# Patient Record
Sex: Female | Born: 1955 | Race: White | Hispanic: No | Smoking: Former smoker
Health system: Southern US, Community
[De-identification: ages and names within clinical notes are randomized; demographics above are authoritative.]

## PROBLEM LIST (undated history)

## (undated) DIAGNOSIS — I7300 Raynaud's syndrome without gangrene: Secondary | ICD-10-CM

## (undated) DIAGNOSIS — J439 Emphysema, unspecified: Secondary | ICD-10-CM

## (undated) DIAGNOSIS — Z87891 Personal history of nicotine dependence: Secondary | ICD-10-CM

## (undated) DIAGNOSIS — M5136 Other intervertebral disc degeneration, lumbar region: Secondary | ICD-10-CM

## (undated) DIAGNOSIS — M064 Inflammatory polyarthropathy: Secondary | ICD-10-CM

## (undated) DIAGNOSIS — M1990 Unspecified osteoarthritis, unspecified site: Secondary | ICD-10-CM

## (undated) DIAGNOSIS — M419 Scoliosis, unspecified: Secondary | ICD-10-CM

## (undated) DIAGNOSIS — K635 Polyp of colon: Secondary | ICD-10-CM

## (undated) DIAGNOSIS — I10 Essential (primary) hypertension: Secondary | ICD-10-CM

## (undated) DIAGNOSIS — M51369 Other intervertebral disc degeneration, lumbar region without mention of lumbar back pain or lower extremity pain: Secondary | ICD-10-CM

## (undated) DIAGNOSIS — M349 Systemic sclerosis, unspecified: Principal | ICD-10-CM

## (undated) HISTORY — DX: Emphysema, unspecified: J43.9

## (undated) HISTORY — PX: SHOULDER SURGERY: SHX246

## (undated) HISTORY — DX: Other intervertebral disc degeneration, lumbar region: M51.36

## (undated) HISTORY — DX: Personal history of nicotine dependence: Z87.891

## (undated) HISTORY — DX: Polyp of colon: K63.5

## (undated) HISTORY — DX: Essential (primary) hypertension: I10

## (undated) HISTORY — DX: Other intervertebral disc degeneration, lumbar region without mention of lumbar back pain or lower extremity pain: M51.369

## (undated) HISTORY — DX: Scoliosis, unspecified: M41.9

## (undated) HISTORY — DX: Raynaud's syndrome without gangrene: I73.00

## (undated) HISTORY — PX: BREAST EXCISIONAL BIOPSY: SUR124

## (undated) HISTORY — DX: Unspecified osteoarthritis, unspecified site: M19.90

## (undated) HISTORY — DX: Systemic sclerosis, unspecified: M34.9

## (undated) HISTORY — DX: Inflammatory polyarthropathy: M06.4

---

## 1986-04-24 HISTORY — PX: LAPAROSCOPIC HYSTERECTOMY: SHX1926

## 1986-04-24 HISTORY — PX: ABDOMINAL HYSTERECTOMY: SHX81

## 1997-08-27 ENCOUNTER — Other Ambulatory Visit: Admission: RE | Admit: 1997-08-27 | Discharge: 1997-08-27 | Payer: Self-pay | Admitting: Obstetrics and Gynecology

## 1999-04-06 ENCOUNTER — Other Ambulatory Visit: Admission: RE | Admit: 1999-04-06 | Discharge: 1999-04-06 | Payer: Self-pay | Admitting: Obstetrics and Gynecology

## 2002-09-08 ENCOUNTER — Other Ambulatory Visit: Admission: RE | Admit: 2002-09-08 | Discharge: 2002-09-08 | Payer: Self-pay | Admitting: Family Medicine

## 2005-04-24 HISTORY — PX: CARPAL TUNNEL RELEASE: SHX101

## 2005-05-03 ENCOUNTER — Other Ambulatory Visit: Admission: RE | Admit: 2005-05-03 | Discharge: 2005-05-03 | Payer: Self-pay | Admitting: Family Medicine

## 2005-08-20 ENCOUNTER — Encounter: Admission: RE | Admit: 2005-08-20 | Discharge: 2005-08-20 | Payer: Self-pay | Admitting: Family Medicine

## 2010-04-24 DIAGNOSIS — M349 Systemic sclerosis, unspecified: Secondary | ICD-10-CM

## 2010-04-24 HISTORY — PX: DOPPLER ECHOCARDIOGRAPHY: SHX263

## 2010-04-24 HISTORY — DX: Systemic sclerosis, unspecified: M34.9

## 2010-05-06 ENCOUNTER — Ambulatory Visit (HOSPITAL_COMMUNITY)
Admission: RE | Admit: 2010-05-06 | Discharge: 2010-05-06 | Payer: Self-pay | Source: Home / Self Care | Attending: Family Medicine | Admitting: Family Medicine

## 2010-07-01 ENCOUNTER — Ambulatory Visit
Admission: RE | Admit: 2010-07-01 | Discharge: 2010-07-01 | Disposition: A | Discharge status: Home / Self Care | Payer: Medicaid Other | Source: Ambulatory Visit | Attending: Rheumatology | Admitting: Rheumatology

## 2010-07-01 ENCOUNTER — Other Ambulatory Visit: Payer: Self-pay | Admitting: Rheumatology

## 2010-07-01 DIAGNOSIS — M064 Inflammatory polyarthropathy: Secondary | ICD-10-CM

## 2012-04-24 DIAGNOSIS — K635 Polyp of colon: Secondary | ICD-10-CM

## 2012-04-24 HISTORY — DX: Polyp of colon: K63.5

## 2012-04-24 HISTORY — PX: BREAST BIOPSY: SHX20

## 2012-05-30 ENCOUNTER — Ambulatory Visit (INDEPENDENT_AMBULATORY_CARE_PROVIDER_SITE_OTHER): Payer: Medicare PPO | Admitting: Family Medicine

## 2012-05-30 ENCOUNTER — Encounter: Payer: Self-pay | Admitting: Family Medicine

## 2012-05-30 VITALS — BP 150/82 | HR 68 | Temp 97.8°F | Ht 66.5 in | Wt 163.2 lb

## 2012-05-30 DIAGNOSIS — J438 Other emphysema: Secondary | ICD-10-CM

## 2012-05-30 DIAGNOSIS — M545 Low back pain, unspecified: Secondary | ICD-10-CM | POA: Insufficient documentation

## 2012-05-30 DIAGNOSIS — M349 Systemic sclerosis, unspecified: Secondary | ICD-10-CM

## 2012-05-30 DIAGNOSIS — F17200 Nicotine dependence, unspecified, uncomplicated: Secondary | ICD-10-CM

## 2012-05-30 DIAGNOSIS — M1990 Unspecified osteoarthritis, unspecified site: Secondary | ICD-10-CM | POA: Insufficient documentation

## 2012-05-30 DIAGNOSIS — J439 Emphysema, unspecified: Secondary | ICD-10-CM | POA: Insufficient documentation

## 2012-05-30 DIAGNOSIS — M129 Arthropathy, unspecified: Secondary | ICD-10-CM

## 2012-05-30 DIAGNOSIS — Z23 Encounter for immunization: Secondary | ICD-10-CM

## 2012-05-30 MED ORDER — CYCLOBENZAPRINE HCL 5 MG PO TABS: 5.0000 mg | ORAL_TABLET | Freq: Two times a day (BID) | ORAL | Status: DC | PRN

## 2012-05-30 MED ORDER — NAPROXEN 500 MG PO TABS: 500.0000 mg | ORAL_TABLET | Freq: Two times a day (BID) | ORAL | Status: DC | PRN
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
## 2012-05-30 NOTE — Patient Instructions (Addendum)
Keep an eye on blood pressure, if consistently >140/90, call me Pass by Marion's office to re establish with Dr. Juanetta Gosling Rheum. Return at your convenience fasting for blood work, afterwards for welcome to medicare visit. Good to meet you today, call us with questions. Flu shot today. Refilled flexeril and naprosyn.  Let us know when you need refills.

## 2012-05-30 NOTE — Assessment & Plan Note (Signed)
Continue to encourage cessation. 

## 2012-05-30 NOTE — Assessment & Plan Note (Signed)
Will request records from rheum and prior PCP. Restart naprosyn/flexeril today.

## 2012-05-30 NOTE — Assessment & Plan Note (Signed)
Requested latest spirometry (2012 per pt)

## 2012-05-30 NOTE — Assessment & Plan Note (Signed)
Refer back to rheum.  Encouraged smoking cessation.

## 2012-06-11 ENCOUNTER — Telehealth: Payer: Self-pay | Admitting: *Deleted

## 2012-06-11 NOTE — Telephone Encounter (Signed)
[employer] will not cover Flexeril. They will cover Baclofen. Information in your IN box. Please advise. [facility] [address]

## 2012-06-12 MED ORDER — BACLOFEN 10 MG PO TABS: 10.0000 mg | ORAL_TABLET | Freq: Three times a day (TID) | ORAL | Status: DC | PRN

## 2012-06-12 NOTE — Telephone Encounter (Signed)
Patient notified

## 2012-06-12 NOTE — Telephone Encounter (Signed)
plz notify insurance will cover baclofen better so I'd like her to do trial of this med.

## 2012-06-19 ENCOUNTER — Encounter: Payer: Self-pay | Admitting: Family Medicine

## 2012-07-07 ENCOUNTER — Encounter: Payer: Self-pay | Admitting: Family Medicine

## 2012-08-15 ENCOUNTER — Other Ambulatory Visit: Payer: Self-pay | Admitting: Family Medicine

## 2012-08-15 DIAGNOSIS — Z0000 Encounter for general adult medical examination without abnormal findings: Secondary | ICD-10-CM

## 2012-08-15 DIAGNOSIS — M349 Systemic sclerosis, unspecified: Secondary | ICD-10-CM

## 2012-08-21 ENCOUNTER — Other Ambulatory Visit: Payer: Medicare PPO

## 2012-08-23 ENCOUNTER — Other Ambulatory Visit (INDEPENDENT_AMBULATORY_CARE_PROVIDER_SITE_OTHER): Payer: Medicare PPO

## 2012-08-23 DIAGNOSIS — Z0000 Encounter for general adult medical examination without abnormal findings: Secondary | ICD-10-CM

## 2012-08-23 DIAGNOSIS — Z136 Encounter for screening for cardiovascular disorders: Secondary | ICD-10-CM

## 2012-08-23 DIAGNOSIS — M349 Systemic sclerosis, unspecified: Secondary | ICD-10-CM

## 2012-08-23 LAB — CBC WITH DIFFERENTIAL/PLATELET
Eosinophils Absolute: 0.2 10*3/uL (ref 0.0–0.7)
HCT: 42.1 % (ref 36.0–46.0)
Lymphocytes Relative: 30.2 % (ref 12.0–46.0)
MCV: 91.3 fl (ref 78.0–100.0)
Neutrophils Relative %: 57 % (ref 43.0–77.0)
Platelets: 309 10*3/uL (ref 150.0–400.0)
RBC: 4.61 Mil/uL (ref 3.87–5.11)
WBC: 6.2 10*3/uL (ref 4.5–10.5)

## 2012-08-26 LAB — LIPID PANEL
Cholesterol: 207 mg/dL — ABNORMAL HIGH (ref 0–200)
HDL: 61.5 mg/dL (ref >39.00)
Total CHOL/HDL Ratio: 3

## 2012-08-26 LAB — COMPREHENSIVE METABOLIC PANEL
Alkaline Phosphatase: 101 U/L (ref 39–117)
CO2: 26 mEq/L (ref 19–32)
Calcium: 9.5 mg/dL (ref 8.4–10.5)
Chloride: 105 mEq/L (ref 96–112)
Creatinine, Ser: 0.9 mg/dL (ref 0.4–1.2)
GFR: 72.84 mL/min (ref >60.00)
Potassium: 4.7 mEq/L (ref 3.5–5.1)
Total Protein: 8.1 g/dL (ref 6.0–8.3)

## 2012-08-26 LAB — LDL CHOLESTEROL, DIRECT: Direct LDL: 118 mg/dL

## 2012-08-27 ENCOUNTER — Encounter: Payer: Self-pay | Admitting: Family Medicine

## 2012-08-27 ENCOUNTER — Encounter: Payer: Self-pay | Admitting: Gastroenterology

## 2012-08-27 ENCOUNTER — Ambulatory Visit (INDEPENDENT_AMBULATORY_CARE_PROVIDER_SITE_OTHER): Payer: Medicare PPO | Admitting: Family Medicine

## 2012-08-27 VITALS — BP 144/82 | HR 20 | Temp 97.8°F | Ht 66.5 in | Wt 164.5 lb

## 2012-08-27 DIAGNOSIS — Z139 Encounter for screening, unspecified: Secondary | ICD-10-CM

## 2012-08-27 DIAGNOSIS — Z0000 Encounter for general adult medical examination without abnormal findings: Secondary | ICD-10-CM

## 2012-08-27 DIAGNOSIS — F17200 Nicotine dependence, unspecified, uncomplicated: Secondary | ICD-10-CM

## 2012-08-27 DIAGNOSIS — Z1231 Encounter for screening mammogram for malignant neoplasm of breast: Secondary | ICD-10-CM

## 2012-08-27 DIAGNOSIS — Z23 Encounter for immunization: Secondary | ICD-10-CM

## 2012-08-27 DIAGNOSIS — Z1211 Encounter for screening for malignant neoplasm of colon: Secondary | ICD-10-CM

## 2012-08-27 NOTE — Assessment & Plan Note (Signed)
I have personally reviewed the Welcome to Medicare questionnaire and have noted 1. The patient's medical and social history 2. Their use of alcohol, tobacco or illicit drugs 3. Their current medications and supplements 4. The patient's functional ability including ADL's, fall risks, home safety risks and hearing or visual impairment. 5. Diet and physical activity 6. Evidence for depression or mood disorders The patients weight, height, BMI have been recorded in the chart.  Hearing and vision has been addressed. I have made referrals, counseling and provided education to the patient based review of the above and I have provided the pt with a written personalized care plan for preventive services. See scanned questionairre. Advanced directives discussed: does not want prolonged life support  Reviewed preventative protocols and updated unless pt declined.  Breast exam today. To schedule mammogram and colonscoopy.  Prior saw [facility] for mammo - advised to call and reschedule. EKG for welcome to medicare visit done today.

## 2012-08-27 NOTE — Addendum Note (Signed)
Addended by: Josph Macho A on: 08/27/2012 12:10 PM   Modules accepted: Orders

## 2012-08-27 NOTE — Addendum Note (Signed)
Addended by: Josph Macho A on: 08/27/2012 11:52 AM   Modules accepted: Orders

## 2012-08-27 NOTE — Assessment & Plan Note (Signed)
Contemplative. Encourage continued attempts at cessation.
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
## 2012-08-27 NOTE — Patient Instructions (Addendum)
Pneumonia shot today. Pass by Marion's office to schedule colonoscopy. Call [facility] t o schedule mammogram at your convenience. EKG today. Good to see you today, call us with questions.

## 2012-08-29 ENCOUNTER — Other Ambulatory Visit: Payer: Self-pay

## 2012-08-29 MED ORDER — NAPROXEN 500 MG PO TABS: 500.0000 mg | ORAL_TABLET | Freq: Two times a day (BID) | ORAL | Status: DC | PRN

## 2012-08-29 NOTE — Telephone Encounter (Signed)
Filled and placed in my out box. 

## 2012-08-29 NOTE — Telephone Encounter (Signed)
Pt left v/m [facility] will fax refill request form for Naproxen. (pt has changed pharmacy to [facility]). Form from [facility] is in Dr Timoteo Expose in box.

## 2012-08-30 ENCOUNTER — Encounter: Payer: Self-pay | Admitting: *Deleted

## 2012-09-24 ENCOUNTER — Encounter: Payer: Self-pay | Admitting: Family Medicine

## 2012-09-24 ENCOUNTER — Ambulatory Visit (INDEPENDENT_AMBULATORY_CARE_PROVIDER_SITE_OTHER): Payer: Medicare PPO | Admitting: Family Medicine

## 2012-09-24 VITALS — BP 140/100 | HR 84 | Temp 98.0°F | Wt 162.0 lb

## 2012-09-24 DIAGNOSIS — J069 Acute upper respiratory infection, unspecified: Secondary | ICD-10-CM

## 2012-09-24 DIAGNOSIS — J019 Acute sinusitis, unspecified: Secondary | ICD-10-CM | POA: Insufficient documentation

## 2012-09-24 MED ORDER — AZITHROMYCIN 250 MG PO TABS: ORAL_TABLET | ORAL | Status: DC

## 2012-09-24 MED ORDER — BENZONATATE 100 MG PO CAPS: 100.0000 mg | ORAL_CAPSULE | Freq: Three times a day (TID) | ORAL | Status: DC | PRN

## 2012-09-24 NOTE — Assessment & Plan Note (Signed)
Anticipate viral but given comorbidities (COPD, smoker, scleroderma) provided with [facility] script for zpack. See pt instructions for plan.

## 2012-09-24 NOTE — Patient Instructions (Signed)
Avoid pseudophed. Sounds like you have a viral upper respiratory infection. Antibiotics are not needed for this.  Viral infections usually take 7-10 days to resolve.  The cough can last several weeks to go away. Use medication as prescribed: tessalon perls for cough. Push fluids and plenty of rest. May use plain mucinex with lots of water to help mobilize mucous. Please return if you are not improving as expected, or if you have high fevers (>101.5) or difficulty swallowing or worsening productive cough. Call clinic with questions.  Good to see you today.

## 2012-09-24 NOTE — Progress Notes (Signed)
  Subjective:    Patient ID: Brittney Myers, female    DOB: [birth year], 57 y.o.   MRN: [id number]  HPI CC: "i'm sick"  1 wk h/o rhinorrhea that progressed to ear pain, sore throat, persistent cough productive of clear mucous.  Blowing clear mucous from nose.  +facial pressure headache.  R>L ear pain.  Subjective fevers/chills.  Mild diarrhea.  Noticing more wheezy and dyspneic, increased productive cough.  So far has tried pseudophed and robitussin.  No abd pain, nausea, tooth pain.  Grandchild sick recently with diarrhea. 1/2 ppd smoker. H/o COPD - emphysema.  Past Medical History  Diagnosis Date  . Arthritis   . Emphysema     spirometry 04/2010 - FVC 66%, FEV1 53%, ratio 0.63 moderately severe obstruction  . 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 Scoliosis   . DDD (degenerative disc disease), lumbar   . Smoker   . Raynaud's disease     hands  . Inflammatory polyarthritis      Review of Systems Per HPI    Objective:   Physical Exam  Nursing note and vitals reviewed. Constitutional: She appears well-developed and well-nourished. No distress.  HENT:  Head: Normocephalic and atraumatic.  Right Ear: Hearing, tympanic membrane, external ear and ear canal normal.  Left Ear: Hearing, tympanic membrane, external ear and ear canal normal.  Nose: Mucosal edema present. No rhinorrhea. Right sinus exhibits maxillary sinus tenderness and frontal sinus tenderness. Left sinus exhibits no maxillary sinus tenderness and no frontal sinus tenderness.  Mouth/Throat: Uvula is midline, oropharynx is clear and moist and mucous membranes are normal. No oropharyngeal exudate, posterior oropharyngeal edema, posterior oropharyngeal erythema or tonsillar abscesses.  Pale turbinates  Eyes: Conjunctivae and EOM are normal. Pupils are equal, round, and reactive to light. No scleral icterus.  Neck: Normal range of motion. Neck supple.  Cardiovascular: Normal rate, regular rhythm, normal heart  sounds and intact distal pulses.   No murmur heard. Pulmonary/Chest: Effort normal and breath sounds normal. No respiratory distress. She has no wheezes. She has no rales.  Lymphadenopathy:    She has no cervical adenopathy.  Skin: Skin is warm and dry. No rash noted.       Assessment & Plan:

## 2012-10-03 ENCOUNTER — Ambulatory Visit
Admission: RE | Admit: 2012-10-03 | Discharge: 2012-10-03 | Disposition: A | Discharge status: Home / Self Care | Payer: Medicare PPO | Source: Ambulatory Visit | Attending: Family Medicine | Admitting: Family Medicine

## 2012-10-03 DIAGNOSIS — Z1231 Encounter for screening mammogram for malignant neoplasm of breast: Secondary | ICD-10-CM

## 2012-10-07 ENCOUNTER — Other Ambulatory Visit: Payer: Self-pay | Admitting: Family Medicine

## 2012-10-07 DIAGNOSIS — R928 Other abnormal and inconclusive findings on diagnostic imaging of breast: Secondary | ICD-10-CM

## 2012-10-21 ENCOUNTER — Other Ambulatory Visit: Payer: Self-pay | Admitting: Family Medicine

## 2012-10-21 ENCOUNTER — Ambulatory Visit
Admission: RE | Admit: 2012-10-21 | Discharge: 2012-10-21 | Disposition: A | Discharge status: Home / Self Care | Payer: Medicare PPO | Source: Ambulatory Visit | Attending: Family Medicine | Admitting: Family Medicine

## 2012-10-21 DIAGNOSIS — R928 Other abnormal and inconclusive findings on diagnostic imaging of breast: Secondary | ICD-10-CM

## 2012-10-22 HISTORY — PX: COLONOSCOPY: SHX174

## 2012-10-23 ENCOUNTER — Ambulatory Visit
Admission: RE | Admit: 2012-10-23 | Discharge: 2012-10-23 | Disposition: A | Discharge status: Home / Self Care | Payer: Medicare PPO | Source: Ambulatory Visit | Attending: Family Medicine | Admitting: Family Medicine

## 2012-10-23 DIAGNOSIS — R928 Other abnormal and inconclusive findings on diagnostic imaging of breast: Secondary | ICD-10-CM

## 2012-10-28 ENCOUNTER — Ambulatory Visit (AMBULATORY_SURGERY_CENTER): Payer: Medicare PPO | Admitting: *Deleted

## 2012-10-28 VITALS — Ht 66.5 in | Wt 163.0 lb

## 2012-10-28 DIAGNOSIS — Z1211 Encounter for screening for malignant neoplasm of colon: Secondary | ICD-10-CM

## 2012-10-28 MED ORDER — MOVIPREP 100 G PO SOLR: 1.0000 | Freq: Once | ORAL | Status: DC

## 2012-10-28 NOTE — Progress Notes (Signed)
Denies allergies to eggs or soy products. Denies complications with anesthesia or sedation. 

## 2012-10-29 ENCOUNTER — Encounter: Payer: Self-pay | Admitting: Gastroenterology

## 2012-11-04 ENCOUNTER — Telehealth: Payer: Self-pay | Admitting: Gastroenterology

## 2012-11-04 NOTE — Telephone Encounter (Signed)
Spoke with patient and answered all her questions and concerns at this time. Encouraged patient to call us back if needed.

## 2012-11-08 ENCOUNTER — Ambulatory Visit (INDEPENDENT_AMBULATORY_CARE_PROVIDER_SITE_OTHER): Payer: Self-pay | Admitting: Surgery

## 2012-11-08 ENCOUNTER — Encounter: Payer: Self-pay | Admitting: *Deleted

## 2012-11-11 ENCOUNTER — Ambulatory Visit (AMBULATORY_SURGERY_CENTER): Payer: Medicare PPO | Admitting: Gastroenterology

## 2012-11-11 ENCOUNTER — Encounter: Payer: Self-pay | Admitting: Gastroenterology

## 2012-11-11 VITALS — BP 124/87 | HR 57 | Temp 97.6°F | Resp 21 | Ht 66.5 in | Wt 163.0 lb

## 2012-11-11 DIAGNOSIS — Z1211 Encounter for screening for malignant neoplasm of colon: Secondary | ICD-10-CM

## 2012-11-11 DIAGNOSIS — D126 Benign neoplasm of colon, unspecified: Secondary | ICD-10-CM

## 2012-11-11 MED ORDER — SODIUM CHLORIDE 0.9 % IV SOLN: 500.0000 mL | INTRAVENOUS | Status: DC

## 2012-11-11 NOTE — Op Note (Addendum)
[facility] [address] [place], [address]   COLONOSCOPY PROCEDURE REPORT PATIENT: Brittney Myers, Brittney Myers  MR#: [id number] BIRTHDATE: 1955/12/18 , 57  yrs. old GENDER: Female ENDOSCOPIST: Meryl Dare, MD, [facility] REFERRED XB:JYNWGN Sharen Hones, M.D. PROCEDURE DATE:  11/11/2012 PROCEDURE:   Colonoscopy with biopsy and snare polypectomy First Screening Colonoscopy - Avg.  risk and is 50 yrs.  old or older Yes.  Prior Negative Screening - Now for repeat screening. N/A  History of Adenoma - Now for follow-up colonoscopy & has been > or = to 3 yrs.  N/A  Polyps Removed Today? Yes. ASA CLASS:   Class II INDICATIONS:average risk screening. MEDICATIONS: MAC sedation, administered by CRNA and propofol (Diprivan) 250mg  IV DESCRIPTION OF PROCEDURE:   After the risks benefits and alternatives of the procedure were thoroughly explained, informed consent was obtained.  A digital rectal exam revealed no abnormalities of the rectum.   The LB [id number]  endoscope was introduced through the anus and advanced to the cecum, which was identified by both the appendix and ileocecal valve. No adverse events experienced.   The quality of the prep was excellent, using MoviPrep  The instrument was then slowly withdrawn as the colon was fully examined.  COLON FINDINGS: A sessile polyp measuring 4 mm in size was found at the hepatic flexure.  A polypectomy was performed with cold forceps.  The resection was complete and the polyp tissue was completely retrieved.   A sessile polyp measuring 8 mm in size was found in the transverse colon.  A polypectomy was performed with a cold snare.  The resection was complete and the polyp tissue was completely retrieved.   Four sessile polyps measuring 5-6 mm in size were found in the sigmoid colon.  A polypectomy was performed with a cold snare.  The resection was complete and the polyp tissue was completely retrieved.   A sessile polyp measuring 5 mm  in size was found in the rectum.  A polypectomy was performed with a cold snare.  The resection was complete and the polyp tissue was completely retrieved.   The colon was otherwise normal.  There was no diverticulosis, inflammation, polyps or cancers unless previously stated.  Retroflexed views revealed no abnormalities. The time to cecum=2 minutes 17 seconds.  Withdrawal time=12 minutes 00 seconds.  The scope was withdrawn and the procedure completed. COMPLICATIONS: There were no complications. ENDOSCOPIC IMPRESSION: 1.   Sessile polyp measuring 4 mm at the hepatic flexure; polypectomy performed with cold forceps 2.   Sessile polyp measuring 8 mm in the transverse colon; polypectomy performed with a cold snare 3.   Four sessile polyps measuring 5-6 mm in the sigmoid colon; polypectomy performed with a cold snare 4.   Sessile polyp measuring 5 mm in the rectum; polypectomy performed with a cold snare RECOMMENDATIONS: 1.  Await pathology results 2.  Repeat colonoscopy in 3 years if 3 or more polyps adenomatous; 5 years if 1-2 adenomatous; otherwise 10 years  eSigned:  Meryl Dare, MD, [facility] 11/11/2012 9:23 AM Revised: 11/11/2012 9:23 AM

## 2012-11-11 NOTE — Progress Notes (Signed)
Oxygen sat range from 86-91% pt. Stated I just finished smoking, room staff made aware. Pt. Instructed to do deep breathing and coughing.

## 2012-11-11 NOTE — Progress Notes (Signed)
Patient did not experience any of the following events: a burn prior to discharge; a fall within the facility; wrong site/side/patient/procedure/implant event; or a hospital transfer or hospital admission upon discharge from the facility. (G8907) Patient did not have preoperative order for IV antibiotic SSI prophylaxis. (G8918)  

## 2012-11-11 NOTE — Progress Notes (Signed)
Called to room to assist during endoscopic procedure.  Patient ID and intended procedure confirmed with present staff. Received instructions for my participation in the procedure from the performing physician.  

## 2012-11-11 NOTE — Patient Instructions (Addendum)

## 2012-11-12 ENCOUNTER — Telehealth: Payer: Self-pay | Admitting: *Deleted

## 2012-11-12 NOTE — Telephone Encounter (Signed)
  Follow up Call-  Call back number 11/11/2012  Post procedure Call Back phone  # [phone]  Permission to leave phone message Yes     Patient questions:  Do you have a fever, pain , or abdominal swelling? no Pain Score  0 *  Have you tolerated food without any problems? yes  Have you been able to return to your normal activities? yes  Do you have any questions about your discharge instructions: Diet   no Medications  no Follow up visit  no  Do you have questions or concerns about your Care? no  Actions: * If pain score is 4 or above: No action needed, pain <4.

## 2012-11-15 ENCOUNTER — Encounter: Payer: Self-pay | Admitting: Gastroenterology

## 2012-11-15 ENCOUNTER — Encounter: Payer: Self-pay | Admitting: Family Medicine

## 2012-12-03 ENCOUNTER — Ambulatory Visit (INDEPENDENT_AMBULATORY_CARE_PROVIDER_SITE_OTHER): Payer: Medicare HMO | Admitting: General Surgery

## 2012-12-03 ENCOUNTER — Encounter: Payer: Self-pay | Admitting: General Surgery

## 2012-12-03 ENCOUNTER — Other Ambulatory Visit: Payer: Self-pay

## 2012-12-03 VITALS — BP 130/80 | HR 74 | Resp 14 | Ht 66.0 in | Wt 161.0 lb

## 2012-12-03 DIAGNOSIS — N63 Unspecified lump in unspecified breast: Secondary | ICD-10-CM
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
## 2012-12-03 NOTE — Patient Instructions (Addendum)
Patient instructed on procedure excision of breast mass

## 2012-12-04 ENCOUNTER — Telehealth: Payer: Self-pay | Admitting: *Deleted

## 2012-12-04 ENCOUNTER — Encounter: Payer: Self-pay | Admitting: General Surgery

## 2012-12-04 NOTE — Telephone Encounter (Signed)
Message left on home and cell numbers for patient to call the office.   Dr. Evette Cristal was not able to speak with the radiologists due to vacation/maternity leave. He feels it is appropriate to proceed with right breast mass excision with needle localization and ultrasound guidance. We just need to arrange a date for surgery at patient's convenience.

## 2012-12-05 ENCOUNTER — Telehealth: Payer: Self-pay | Admitting: *Deleted

## 2012-12-05 NOTE — Telephone Encounter (Signed)
Patient's surgery has been scheduled for 12-27-12 at [facility]. This patient has been instructed to stop by the [facility] this week or next to sign a release so they can get records from [facility]. She will call the office if she has further questions.

## 2012-12-17 ENCOUNTER — Other Ambulatory Visit: Payer: Self-pay | Admitting: General Surgery

## 2012-12-17 DIAGNOSIS — N63 Unspecified lump in unspecified breast: Secondary | ICD-10-CM

## 2012-12-26 ENCOUNTER — Telehealth: Payer: Self-pay

## 2012-12-26 NOTE — Telephone Encounter (Signed)
Heather at [facility] preadmission left v/m requesting pt most recent office note, labs and chest xray if available. Faxed 08/27/12 and 09/24/12 office notes and 08/23/12 labs. No chest xray noted. Pt having surgery 01/06/13.left v/m for [employer] faxed.

## 2012-12-27 ENCOUNTER — Encounter: Payer: Self-pay | Admitting: General Surgery

## 2012-12-27 ENCOUNTER — Ambulatory Visit: Payer: Self-pay | Admitting: General Surgery

## 2012-12-27 DIAGNOSIS — D249 Benign neoplasm of unspecified breast: Secondary | ICD-10-CM

## 2012-12-31 LAB — PATHOLOGY REPORT

## 2013-01-02 ENCOUNTER — Encounter: Payer: Self-pay | Admitting: General Surgery

## 2013-01-08 ENCOUNTER — Ambulatory Visit (INDEPENDENT_AMBULATORY_CARE_PROVIDER_SITE_OTHER): Payer: Medicare HMO | Admitting: General Surgery

## 2013-01-08 VITALS — BP 160/80 | HR 68 | Resp 12 | Ht 66.0 in | Wt 162.0 lb

## 2013-01-08 DIAGNOSIS — N6019 Diffuse cystic mastopathy of unspecified breast: Secondary | ICD-10-CM | POA: Insufficient documentation

## 2013-01-08 DIAGNOSIS — N6011 Diffuse cystic mastopathy of right breast: Secondary | ICD-10-CM

## 2013-01-08 DIAGNOSIS — N63 Unspecified lump in unspecified breast: Secondary | ICD-10-CM | POA: Insufficient documentation

## 2013-01-08 NOTE — Progress Notes (Signed)
This is a 57 year old female here for her post op right breast mass excision done 12/27/12. Patient states she is doing well. Right breast incision looks clean ,  minimal  Ecchymosis Path report was benign  Fibrocystic changes . Area of concern was successfully removed..Patient to follow up in three months for right breast Ultrasound.

## 2013-01-08 NOTE — Patient Instructions (Addendum)
Patient to return in three months for ultrasound.

## 2013-01-14 ENCOUNTER — Encounter: Payer: Self-pay | Admitting: General Surgery

## 2013-02-27 ENCOUNTER — Ambulatory Visit: Payer: Medicare PPO | Admitting: Family Medicine

## 2013-04-14 ENCOUNTER — Ambulatory Visit: Payer: Medicare HMO | Admitting: General Surgery

## 2013-04-28 ENCOUNTER — Other Ambulatory Visit: Payer: Medicare HMO

## 2013-04-28 ENCOUNTER — Encounter: Payer: Self-pay | Admitting: General Surgery

## 2013-04-28 ENCOUNTER — Ambulatory Visit (INDEPENDENT_AMBULATORY_CARE_PROVIDER_SITE_OTHER): Payer: Medicare HMO | Admitting: General Surgery

## 2013-04-28 VITALS — BP 140/80 | HR 70 | Resp 13 | Ht 66.0 in | Wt 167.0 lb

## 2013-04-28 DIAGNOSIS — N6019 Diffuse cystic mastopathy of unspecified breast: Secondary | ICD-10-CM

## 2013-04-28 DIAGNOSIS — N63 Unspecified lump in unspecified breast: Secondary | ICD-10-CM

## 2013-04-28 NOTE — Patient Instructions (Signed)
Continue self breast exams. Call office for any new breast issues or concerns. 
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
## 2013-07-14 ENCOUNTER — Encounter: Payer: Self-pay | Admitting: Family Medicine

## 2013-07-14 ENCOUNTER — Ambulatory Visit (INDEPENDENT_AMBULATORY_CARE_PROVIDER_SITE_OTHER): Payer: Medicare PPO | Admitting: Family Medicine

## 2013-07-14 ENCOUNTER — Telehealth: Payer: Self-pay | Admitting: Family Medicine

## 2013-07-14 VITALS — BP 132/80 | HR 86 | Temp 98.0°F | Wt 162.8 lb

## 2013-07-14 DIAGNOSIS — J019 Acute sinusitis, unspecified: Secondary | ICD-10-CM

## 2013-07-14 DIAGNOSIS — F17200 Nicotine dependence, unspecified, uncomplicated: Secondary | ICD-10-CM

## 2013-07-14 MED ORDER — DOXYCYCLINE HYCLATE 100 MG PO CAPS: 100.0000 mg | ORAL_CAPSULE | Freq: Two times a day (BID) | ORAL | Status: DC

## 2013-07-14 MED ORDER — VARENICLINE TARTRATE 0.5 MG X 11 & 1 MG X 42 PO MISC: ORAL | Status: DC

## 2013-07-14 MED ORDER — NAPROXEN 500 MG PO TABS: 500.0000 mg | ORAL_TABLET | Freq: Two times a day (BID) | ORAL | Status: DC | PRN

## 2013-07-14 MED ORDER — VARENICLINE TARTRATE 1 MG PO TABS: 1.0000 mg | ORAL_TABLET | Freq: Two times a day (BID) | ORAL | Status: DC

## 2013-07-14 NOTE — Patient Instructions (Addendum)
You have a sinus infection. Take medicine as prescribed: doxycycline course for 10 days Push fluids and plenty of rest. Nasal saline irrigation or neti pot to help drain sinuses. May use simple mucinex with plenty of fluid to help mobilize mucous. Let us know if fever >101.5, trouble opening/closing mouth, difficulty swallowing, or worsening - you may need to be seen again. chantix prescription provided today. Good to see you today, call us with quesitons.

## 2013-07-14 NOTE — Progress Notes (Signed)
Pre visit review using our clinic review tool, if applicable. No additional management support is needed unless otherwise documented below in the visit note. 

## 2013-07-14 NOTE — Telephone Encounter (Signed)
Seen today. 

## 2013-07-14 NOTE — Addendum Note (Signed)
Addended by: Ria Bush on: 07/14/2013 03:31 PM   Modules accepted: Orders

## 2013-07-14 NOTE — Progress Notes (Signed)
BP 132/80  Pulse 86  Temp(Src) 98 F (36.7 C) (Oral)  Wt 162 lb 12 oz (73.823 kg)  SpO2 96%   CC: sinus headache  Subjective:    Patient ID: Brittney Myers, female    DOB: 06-12-1955, 58 y.o.   MRN: [id number]  HPI: Brittney Myers is a 58 y.o. female presenting on 07/14/2013 for Nasal Congestion and Headache   Persistent sinus headache frontal and maxillary bilaterally for last 9 days, no with PNdrainage. + ear pain bilaterally.  Mild cough started last night with clear drainage.  + wheezy and mildly dyspneic.  Night time head congestion.  Headache persistent.  Head > chest congestion  No fevers/chills, tooth pain, abd pain, nausea.  + grandchildren sick with stomach virus recently.  Was around cats who she's allergic to. H/o emphysema Did receive flu shot. Decreased smoking since she's felt ill.  Asks about retrial of chantix.  Motivated to quit.  Has taken in the past.  Discussed side effects of chantix.  Relevant past medical, surgical, family and social history reviewed and updated as indicated.  Allergies and medications reviewed and updated. Current Outpatient Prescriptions on File Prior to Visit  Medication Sig  . cyclobenzaprine (FLEXERIL) 5 MG tablet Take 5 mg by mouth 3 (three) times daily as needed for muscle spasms.  . naproxen (NAPROSYN) 500 MG tablet Take 1 tablet (500 mg total) by mouth 2 (two) times daily as needed.   No current facility-administered medications on file prior to visit.    Review of Systems Per HPI unless specifically indicated above    Objective:    BP 132/80  Pulse 86  Temp(Src) 98 F (36.7 C) (Oral)  Wt 162 lb 12 oz (73.823 kg)  SpO2 96%  Physical Exam  Nursing note and vitals reviewed. Constitutional: She appears well-developed and well-nourished. No distress.  HENT:  Head: Normocephalic and atraumatic.  Right Ear: Hearing, tympanic membrane, external ear and ear canal normal.  Left Ear: Hearing, tympanic membrane, external ear and  ear canal normal.  Nose: No mucosal edema or rhinorrhea. Right sinus exhibits maxillary sinus tenderness and frontal sinus tenderness. Left sinus exhibits maxillary sinus tenderness and frontal sinus tenderness.  Mouth/Throat: Uvula is midline, oropharynx is clear and moist and mucous membranes are normal. No oropharyngeal exudate, posterior oropharyngeal edema, posterior oropharyngeal erythema or tonsillar abscesses.  Eyes: Conjunctivae and EOM are normal. Pupils are equal, round, and reactive to light. No scleral icterus.  Neck: Normal range of motion. Neck supple.  Cardiovascular: Normal rate, regular rhythm, normal heart sounds and intact distal pulses.   No murmur heard. Pulmonary/Chest: Effort normal and breath sounds normal. No respiratory distress. She has no wheezes. She has no rales.  Lymphadenopathy:    She has no cervical adenopathy.  Skin: Skin is warm and dry. No rash noted.       Assessment & Plan:   Problem List Items Addressed This Visit   Acute sinusitis - Primary     Discussed dx and expected course of resolution. Doxy course provided otday (PCN allergy). Update if sxs persist or fail to improve.    Relevant Medications      doxy 10d   Smoker     Pt requests trial of chantix - discussed common side effects including but not limited to nausea/vomiting, vivid dreams, behaviour changes, discussed possible CV risk association. Discussed setting quit date 1 wk into script.        Follow up plan: No Follow-up on file.

## 2013-07-14 NOTE — Assessment & Plan Note (Signed)
Pt requests trial of chantix - discussed common side effects including but not limited to nausea/vomiting, vivid dreams, behaviour changes, discussed possible CV risk association. Discussed setting quit date 1 wk into script.

## 2013-07-14 NOTE — Assessment & Plan Note (Signed)
Discussed dx and expected course of resolution. Doxy course provided otday (PCN allergy). Update if sxs persist or fail to improve.

## 2013-07-14 NOTE — Telephone Encounter (Signed)
Patient Information:  Caller Name: Ameliah  Phone: [phone]  Patient: Brittney Myers, Brittney Myers  Gender: Female  DOB: 19-Sep-1955  Age: 58 Years  PCP: Ria Bush [facility])  Pregnant: No  Office Follow Up:  Does the office need to follow up with this patient?: No  Instructions For The Office: N/A   Symptoms  Reason For Call & Symptoms: Onset of runny nose, congestion, and headache on 07/06/2013.  Reviewed Health History In EMR: Yes  Reviewed Medications In EMR: Yes  Reviewed Allergies In EMR: Yes  Reviewed Surgeries / Procedures: Yes  Date of Onset of Symptoms: 07/06/2013 OB / GYN:  LMP: Unknown  Guideline(s) Used:  Colds  Hay Fever - Nasal Allergies  Asthma Attack  Disposition Per Guideline:   See Today or Tomorrow in Office  Reason For Disposition Reached:   Sinus pain (around cheekbone or eye)  Advice Given:  Call Back If:  Wheezing has not completely cleared after 5 days  You become worse.  Patient Will Follow Care Advice:  YES  Appointment Scheduled:  07/14/2013 14:45:00 Appointment Scheduled Provider:  Ria Bush [facility])

## 2013-08-22 ENCOUNTER — Other Ambulatory Visit: Payer: Self-pay | Admitting: Family Medicine

## 2013-08-22 ENCOUNTER — Other Ambulatory Visit (INDEPENDENT_AMBULATORY_CARE_PROVIDER_SITE_OTHER): Payer: Medicare PPO

## 2013-08-22 DIAGNOSIS — E785 Hyperlipidemia, unspecified: Secondary | ICD-10-CM

## 2013-08-22 DIAGNOSIS — E782 Mixed hyperlipidemia: Secondary | ICD-10-CM | POA: Insufficient documentation

## 2013-08-22 LAB — BASIC METABOLIC PANEL
BUN: 17 mg/dL (ref 6–23)
CALCIUM: 9.6 mg/dL (ref 8.4–10.5)
CHLORIDE: 106 meq/L (ref 96–112)
CO2: 29 meq/L (ref 19–32)
Creatinine, Ser: 0.7 mg/dL (ref 0.4–1.2)
GFR: 100.25 mL/min (ref >60.00)
GLUCOSE: 93 mg/dL (ref 70–99)
Potassium: 4.9 mEq/L (ref 3.5–5.1)
SODIUM: 141 meq/L (ref 135–145)

## 2013-08-22 LAB — LIPID PANEL
CHOL/HDL RATIO: 3
Cholesterol: 168 mg/dL (ref 0–200)
HDL: 57 mg/dL (ref >39.00)
LDL Cholesterol: 89 mg/dL (ref 0–99)
Triglycerides: 108 mg/dL (ref 0.0–149.0)
VLDL: 21.6 mg/dL (ref 0.0–40.0)

## 2013-08-29 ENCOUNTER — Ambulatory Visit (INDEPENDENT_AMBULATORY_CARE_PROVIDER_SITE_OTHER): Payer: Commercial Managed Care - HMO | Admitting: Family Medicine

## 2013-08-29 ENCOUNTER — Encounter: Payer: Self-pay | Admitting: Family Medicine

## 2013-08-29 VITALS — BP 142/84 | HR 84 | Temp 98.1°F | Ht 66.5 in | Wt 162.5 lb

## 2013-08-29 DIAGNOSIS — E785 Hyperlipidemia, unspecified: Secondary | ICD-10-CM

## 2013-08-29 DIAGNOSIS — Z87891 Personal history of nicotine dependence: Secondary | ICD-10-CM

## 2013-08-29 DIAGNOSIS — Z0000 Encounter for general adult medical examination without abnormal findings: Secondary | ICD-10-CM

## 2013-08-29 DIAGNOSIS — L84 Corns and callosities: Secondary | ICD-10-CM

## 2013-08-29 MED ORDER — VARENICLINE TARTRATE 0.5 MG PO TABS: 0.5000 mg | ORAL_TABLET | Freq: Two times a day (BID) | ORAL | Status: DC

## 2013-08-29 NOTE — Assessment & Plan Note (Signed)
Resolved with healthier diet.

## 2013-08-29 NOTE — Patient Instructions (Addendum)
Good to see you today, call us with questions. Return as needed or for next wellness exam in 1 year, prior fasting for blood work. Try corn/callus pads for right sole. Try lower chantix dose (0.5mg  twice daily sent into pharmacy).

## 2013-08-29 NOTE — Progress Notes (Signed)
BP 142/84  Pulse 84  Temp(Src) 98.1 F (36.7 C) (Oral)  Ht 5' 6.5" (1.689 m)  Wt 162 lb 8 oz (73.71 kg)  BMI 25.84 kg/m2   CC: first medicare wellness visit  Subjective:    Patient ID: Brittney Myers, female    DOB: October 22, 1955, 58 y.o.   MRN: [id number]  HPI: Brittney Myers is a 58 y.o. female presenting on 08/29/2013 for Annual Exam   Quit smoking!! 08/18/2013. Using chantix. Finds it makes her sleepy but tolerating otherwise.  + nausea as well.  Feeling better - improved taste in food, increased energy, happier.  Congratulated. Increased appetite but eating healthy. Wt Readings from Last 3 Encounters:  08/29/13 162 lb 8 oz (73.71 kg)  07/14/13 162 lb 12 oz (73.823 kg)  04/28/13 167 lb (75.751 kg)   Body mass index is 25.84 kg/(m^2).  Passes vision screen, stable hearing screen.  Denies depression/anhedonia. No falls in last year.  Preventative  Colon cancer screening -  COLONOSCOPY Date: 10/2012 mult polyps, rpt 5 yrs [facility] Plan) Mammogram - abnormal mammogram with biopsy (WNL) - due for f/u in August to be scheduled by Dr. Jamal Collin.  Well woman - pap smear done at age 76- s/p hysterectomy 1988. Tdap - 2012 to take care of grandchild  Flu shot - 05/2012  Pneumovax - 08/2012 Advanced directives: has not discussed living will in past. Needs to update (not done since she was married).  Doesn't want prolonged life support if brain dead. Daughter would be HCPOA (April Barber).  Lives alone. Divorced (2004). 3 grown children, 10 grandchildren  Occupation: was Chief Executive Officer, on disability for arthritis and scleroderma - watches grandchildren  Edu: Associate's degree with 4.0 GPA Activity: no regular exercise. Interested in silver sneakers  Diet: good water, fruits/vegetables daily   Relevant past medical, surgical, family and social history reviewed and updated as indicated.  Allergies and medications reviewed and updated. Current Outpatient Prescriptions on File Prior to Visit    Medication Sig  . cyclobenzaprine (FLEXERIL) 5 MG tablet Take 5 mg by mouth 3 (three) times daily as needed for muscle spasms.  . naproxen (NAPROSYN) 500 MG tablet Take 1 tablet (500 mg total) by mouth 2 (two) times daily as needed.   No current facility-administered medications on file prior to visit.    Review of Systems  Constitutional: Positive for appetite change. Negative for fever, chills, activity change, fatigue and unexpected weight change.  HENT: Negative for hearing loss.   Eyes: Negative for visual disturbance.  Respiratory: Negative for cough, chest tightness, shortness of breath and wheezing.   Cardiovascular: Negative for chest pain, palpitations and leg swelling.  Gastrointestinal: Negative for nausea, vomiting, abdominal pain, diarrhea, constipation, blood in stool and abdominal distention.  Genitourinary: Negative for hematuria and difficulty urinating.  Musculoskeletal: Negative for arthralgias, myalgias and neck pain.  Skin: Negative for rash.  Neurological: Negative for dizziness, seizures, syncope and headaches.  Hematological: Negative for adenopathy. Does not bruise/bleed easily.  Psychiatric/Behavioral: Negative for dysphoric mood. The patient is not nervous/anxious.    Per HPI unless specifically indicated above    Objective:    BP 142/84  Pulse 84  Temp(Src) 98.1 F (36.7 C) (Oral)  Ht 5' 6.5" (1.689 m)  Wt 162 lb 8 oz (73.71 kg)  BMI 25.84 kg/m2  Physical Exam  Nursing note and vitals reviewed. Constitutional: She is oriented to person, place, and time. She appears well-developed and well-nourished. No distress.  HENT:  Head: Normocephalic and atraumatic.  Right Ear: Hearing, tympanic membrane, external ear and ear canal normal.  Left Ear: Hearing, tympanic membrane, external ear and ear canal normal.  Nose: Nose normal.  Mouth/Throat: Uvula is midline, oropharynx is clear and moist and mucous membranes are normal. No oropharyngeal exudate,  posterior oropharyngeal edema or posterior oropharyngeal erythema.  Eyes: Conjunctivae and EOM are normal. Pupils are equal, round, and reactive to light. No scleral icterus.  Neck: Normal range of motion. Neck supple. No thyromegaly present.  Cardiovascular: Normal rate, regular rhythm, normal heart sounds and intact distal pulses.   No murmur heard. Pulses:      Radial pulses are 2+ on the right side, and 2+ on the left side.  Pulmonary/Chest: Effort normal and breath sounds normal. No respiratory distress. She has no wheezes. She has no rales.  Breast exam deferred - recently done by Dr. Jamal Collin (04/2013)  Abdominal: Soft. Bowel sounds are normal. She exhibits no distension and no mass. There is no tenderness. There is no rebound and no guarding.  Musculoskeletal: Normal range of motion. She exhibits no edema.  Corns on right sole  Lymphadenopathy:    She has no cervical adenopathy.  Neurological: She is alert and oriented to person, place, and time.  CN grossly intact, station and gait intact  Skin: Skin is warm and dry. No rash noted.  Psychiatric: She has a normal mood and affect. Her behavior is normal. Judgment and thought content normal.   Results for orders placed in visit on 08/22/13  LIPID PANEL      Result Value Ref Range   Cholesterol 168  0 - 200 mg/dL   Triglycerides 108.0  0.0 - 149.0 mg/dL   HDL 57.00  >39.00 mg/dL   VLDL 21.6  0.0 - 40.0 mg/dL   LDL Cholesterol 89  0 - 99 mg/dL   Total CHOL/HDL Ratio 3    BASIC METABOLIC PANEL      Result Value Ref Range   Sodium 141  135 - 145 mEq/L   Potassium 4.9  3.5 - 5.1 mEq/L   Chloride 106  96 - 112 mEq/L   CO2 29  19 - 32 mEq/L   Glucose, Bld 93  70 - 99 mg/dL   BUN 17  6 - 23 mg/dL   Creatinine, Ser 0.7  0.4 - 1.2 mg/dL   Calcium 9.6  8.4 - 10.5 mg/dL   GFR 100.25  >60.00 mL/min      Assessment & Plan:   Problem List Items Addressed This Visit   Ex-smoker     Congratulated and encouraged continued  cessation. chantix causing side effect of nausea - will decrease dose to 0.5mg  bid for 1 more month.    Medicare annual wellness visit, initial - Primary     I have personally reviewed the Medicare Annual Wellness questionnaire and have noted 1. The patient's medical and social history 2. Their use of alcohol, tobacco or illicit drugs 3. Their current medications and supplements 4. The patient's functional ability including ADL's, fall risks, home safety risks and hearing or visual impairment. 5. Diet and physical activity 6. Evidence for depression or mood disorders The patients weight, height, BMI have been recorded in the chart.  Hearing and vision has been addressed. I have made referrals, counseling and provided education to the patient based review of the above and I have provided the pt with a written personalized care plan for preventive services. See scanned questionairre. Advanced directives discussed: daughter would be HCPOA.  Reviewed  preventative protocols and updated unless pt declined. Breast exam/mammo to be scheduled 11/2013. S/p hysterectomy.    Corn of foot     2 on right sole. Anticipate corns and not plantar warts. Treat with corn pads to buy OTC.    RESOLVED: HLD (hyperlipidemia)     Resolved with healthier diet.        Follow up plan: Return in about 1 year (around 08/30/2014), or as needed, for annual exam, prior fasting for blood work.

## 2013-08-29 NOTE — Progress Notes (Signed)
Pre visit review using our clinic review tool, if applicable. No additional management support is needed unless otherwise documented below in the visit note. 

## 2013-08-29 NOTE — Assessment & Plan Note (Addendum)
I have personally reviewed the Medicare Annual Wellness questionnaire and have noted 1. The patient's medical and social history 2. Their use of alcohol, tobacco or illicit drugs 3. Their current medications and supplements 4. The patient's functional ability including ADL's, fall risks, home safety risks and hearing or visual impairment. 5. Diet and physical activity 6. Evidence for depression or mood disorders The patients weight, height, BMI have been recorded in the chart.  Hearing and vision has been addressed. I have made referrals, counseling and provided education to the patient based review of the above and I have provided the pt with a written personalized care plan for preventive services. See scanned questionairre. Advanced directives discussed: daughter would be HCPOA.  Reviewed preventative protocols and updated unless pt declined. Breast exam/mammo to be scheduled 11/2013. S/p hysterectomy.

## 2013-08-29 NOTE — Assessment & Plan Note (Signed)
2 on right sole. Anticipate corns and not plantar warts. Treat with corn pads to buy OTC.

## 2013-08-29 NOTE — Assessment & Plan Note (Signed)
Congratulated and encouraged continued cessation. chantix causing side effect of nausea - will decrease dose to 0.5mg  bid for 1 more month.

## 2013-11-03 ENCOUNTER — Other Ambulatory Visit: Payer: Self-pay

## 2013-11-03 ENCOUNTER — Other Ambulatory Visit: Payer: Medicare HMO

## 2013-11-03 DIAGNOSIS — N63 Unspecified lump in unspecified breast: Secondary | ICD-10-CM

## 2013-11-20 ENCOUNTER — Ambulatory Visit
Admission: RE | Admit: 2013-11-20 | Discharge: 2013-11-20 | Disposition: A | Discharge status: Home / Self Care | Payer: Commercial Managed Care - HMO | Source: Ambulatory Visit | Attending: General Surgery | Admitting: General Surgery

## 2013-11-20 DIAGNOSIS — N63 Unspecified lump in unspecified breast: Secondary | ICD-10-CM

## 2013-12-02 ENCOUNTER — Ambulatory Visit: Payer: Medicare HMO | Admitting: General Surgery

## 2014-01-22 ENCOUNTER — Encounter: Payer: Self-pay | Admitting: *Deleted

## 2014-02-23 ENCOUNTER — Encounter: Payer: Self-pay | Admitting: Family Medicine

## 2014-03-26 ENCOUNTER — Encounter: Payer: Self-pay | Admitting: Nurse Practitioner

## 2014-03-26 ENCOUNTER — Ambulatory Visit (INDEPENDENT_AMBULATORY_CARE_PROVIDER_SITE_OTHER): Payer: Commercial Managed Care - HMO | Admitting: Nurse Practitioner

## 2014-03-26 VITALS — BP 178/101 | HR 78 | Temp 97.9°F | Resp 18 | Ht 66.5 in | Wt 170.0 lb

## 2014-03-26 DIAGNOSIS — M2550 Pain in unspecified joint: Secondary | ICD-10-CM

## 2014-03-26 DIAGNOSIS — J069 Acute upper respiratory infection, unspecified: Secondary | ICD-10-CM

## 2014-03-26 DIAGNOSIS — I10 Essential (primary) hypertension: Secondary | ICD-10-CM | POA: Insufficient documentation

## 2014-03-26 MED ORDER — NAPROXEN 250 MG PO TABS
250.0000 mg | ORAL_TABLET | Freq: Two times a day (BID) | ORAL | Status: DC | PRN
Start: 2014-03-26 — End: 2014-08-31

## 2014-03-26 MED ORDER — NAPROXEN 250 MG PO TABS: 250.0000 mg | ORAL_TABLET | Freq: Two times a day (BID) | ORAL | Status: DC | PRN

## 2014-03-26 MED ORDER — AMLODIPINE BESYLATE 5 MG PO TABS: 5.0000 mg | ORAL_TABLET | Freq: Every day | ORAL | Status: DC

## 2014-03-26 MED ORDER — AMLODIPINE BESYLATE 5 MG PO TABS
5.0000 mg | ORAL_TABLET | Freq: Every day | ORAL | Status: DC
Start: 2014-03-26 — End: 2014-06-02

## 2014-03-26 NOTE — Progress Notes (Signed)
Subjective:     Brittney Myers is a 58 y.o. female presents to establish care. She complains of new onset nasal congestion , sore throat, & HA that started this am. Brittney Myers was diagnosed w/scleroderma in 2011 & treated by Dr Trudie Reed. She was lost to f/u w/Dr Trudie Reed due to insurance change -has not seen her in 2 years. Pt recalls w/u included CXR, pulm func testing & 2d echo of heart. She has noted swelling R mcp joints in last year or so. She takes naprosyn as needed for back & joint pain in hands. She has taken it several times over last week-has more pain in rainy weather. She is frustrated w/weight gain in last 4 years since she went on disability & has not been as active.  The following portions of the patient's history were reviewed and updated as appropriate: allergies, current medications, past medical history, past social history, past surgical history and problem list.  Review of Systems Constitutional: positive for fatigue, negative for fevers Ears, nose, mouth, throat, and face: positive for nasal congestion and sore throat, negative for earaches Respiratory: positive for cough Cardiovascular: negative for chest pain, irregular heart beat and lower extremity edema Integument/breast: positive for skin lesion(s) and tight skin hands & forearms, face Musculoskeletal:positive for arthralgias, back pain and stiff joints Neurological: positive for headaches Behavioral/Psych: positive for tobacco use and took chantix w/success. started back last summer    Objective:    BP 178/101 mmHg  Pulse 78  Temp(Src) 97.9 F (36.6 C) (Oral)  Resp 18  Ht 5' 6.5" (1.689 m)  Wt 170 lb (77.111 kg)  BMI 27.03 kg/m2  SpO2 95% BP 178/101 mmHg  Pulse 78  Temp(Src) 97.9 F (36.6 C) (Oral)  Resp 18  Ht 5' 6.5" (1.689 m)  Wt 170 lb (77.111 kg)  BMI 27.03 kg/m2  SpO2 95% General appearance: alert, cooperative, appears stated age and no distress Head: Normocephalic, without obvious abnormality,  atraumatic Eyes: negative findings: lids and lashes normal and conjunctivae and sclerae normal Ears: normal TM's and external ear canals both ears Throat: normal findings: lips normal without lesions, buccal mucosa normal, tongue midline and normal and soft palate, uvula, and tonsils normal and missing several teeth Lungs: clear to auscultation bilaterally Heart: regular rate and rhythm, S1, S2 normal, no murmur, click, rub or gallop Extremities: swollen R 2 & 3rd McP joints, unable to make complete fist bilat, tight skin to MCP joints   Skin: pale skin over fingers, small colorless papules over forearms, tight skin & telangiectasias over face Lymph nodes: no cervical LAD     Assessment:plan   1. Essential hypertension Very likely secondary to scleroderma - amLODipine (NORVASC) 5 MG tablet; Take 1 tablet (5 mg total) by mouth daily.  Dispense: 30 tablet; Refill: 1  2. Arthralgia KK:[id number], RA - naproxen (NAPROSYN) 250 MG tablet; Take 1 tablet (250 mg total) by mouth 2 (two) times daily as needed.  Dispense: 60 tablet; Refill: 1  3. Upper respiratory infection Viral Symptom management  See patient instructions for complete plan. F/u 10 days-bp check, discuss smoking cessation, sooner if fever

## 2014-03-26 NOTE — Patient Instructions (Signed)
Please start amlodipine for blood pressure. It will help raynaud's symptoms also.  I have decreased naprosyn as it increases blood pressure.  You have a virus causing your symptoms. The average duration of cold symptoms is 14 days. Start daily sinus rinses (neilmed Sinus Rinse). Tylenol for headache. Vicks vapor rub under nose to help breathe. Benzocaine throat lozenges for sore throat. Sip fluids every hour. Rest.   Please call Dr Trudie Reed to follow up on scleroderma.  Start walking 15 minutes daily, gradually increase to 30 minutes daily as you tolerate.  Please return in 10 days, or sooner if you develop fever.   Visit [employer] website for good information on scleroderma. Upper Respiratory Infection, Adult An upper respiratory infection (URI) is also sometimes known as the common cold. The upper respiratory tract includes the nose, sinuses, throat, trachea, and bronchi. Bronchi are the airways leading to the lungs. Most people improve within 1 week, but symptoms can last up to 2 weeks. A residual cough may last even longer.  CAUSES Many different viruses can infect the tissues lining the upper respiratory tract. The tissues become irritated and inflamed and often become very moist. Mucus production is also common. A cold is contagious. You can easily spread the virus to others by oral contact. This includes kissing, sharing a glass, coughing, or sneezing. Touching your mouth or nose and then touching a surface, which is then touched by another person, can also spread the virus. SYMPTOMS  Symptoms typically develop 1 to 3 days after you come in contact with a cold virus. Symptoms vary from person to person. They may include:  Runny nose.  Sneezing.  Nasal congestion.  Sinus irritation.  Sore throat.  Loss of voice (laryngitis).  Cough.  Fatigue.  Muscle aches.  Loss of appetite.  Headache.  Low-grade fever. DIAGNOSIS  You might diagnose your own  cold based on familiar symptoms, since most people get a cold 2 to 3 times a year. Your caregiver can confirm this based on your exam. Most importantly, your caregiver can check that your symptoms are not due to another disease such as strep throat, sinusitis, pneumonia, asthma, or epiglottitis. Blood tests, throat tests, and X-rays are not necessary to diagnose a common cold, but they may sometimes be helpful in excluding other more serious diseases. Your caregiver will decide if any further tests are required. RISKS AND COMPLICATIONS  You may be at risk for a more severe case of the common cold if you smoke cigarettes, have chronic heart disease (such as heart failure) or lung disease (such as asthma), or if you have a weakened immune system. The very young and very old are also at risk for more serious infections. Bacterial sinusitis, middle ear infections, and bacterial pneumonia can complicate the common cold. The common cold can worsen asthma and chronic obstructive pulmonary disease (COPD). Sometimes, these complications can require emergency medical care and may be life-threatening. PREVENTION  The best way to protect against getting a cold is to practice good hygiene. Avoid oral or hand contact with people with cold symptoms. Wash your hands often if contact occurs. There is no clear evidence that vitamin C, vitamin E, echinacea, or exercise reduces the chance of developing a cold. However, it is always recommended to get plenty of rest and practice good nutrition. TREATMENT  Treatment is directed at relieving symptoms. There is no cure. Antibiotics are not effective, because the infection is caused by a virus, not by bacteria. Treatment may  include:  Increased fluid intake. Sports drinks offer valuable electrolytes, sugars, and fluids.  Breathing heated mist or steam (vaporizer or shower).  Eating chicken soup or other clear broths, and maintaining good nutrition.  Getting plenty of  rest.  Using gargles or lozenges for comfort.  Controlling fevers with ibuprofen or acetaminophen as directed by your caregiver.  Increasing usage of your inhaler if you have asthma. Zinc gel and zinc lozenges, taken in the first 24 hours of the common cold, can shorten the duration and lessen the severity of symptoms. Pain medicines may help with fever, muscle aches, and throat pain. A variety of non-prescription medicines are available to treat congestion and runny nose. Your caregiver can make recommendations and may suggest nasal or lung inhalers for other symptoms.  HOME CARE INSTRUCTIONS   Only take over-the-counter or prescription medicines for pain, discomfort, or fever as directed by your caregiver.  Use a warm mist humidifier or inhale steam from a shower to increase air moisture. This may keep secretions moist and make it easier to breathe.  Drink enough water and fluids to keep your urine clear or pale yellow.  Rest as needed.  Return to work when your temperature has returned to normal or as your caregiver advises. You may need to stay home longer to avoid infecting others. You can also use a face mask and careful hand washing to prevent spread of the virus. SEEK MEDICAL CARE IF:   After the first few days, you feel you are getting worse rather than better.  You need your caregiver's advice about medicines to control symptoms.  You develop chills, worsening shortness of breath, or brown or red sputum. These may be signs of pneumonia.  You develop yellow or brown nasal discharge or pain in the face, especially when you bend forward. These may be signs of sinusitis.  You develop a fever, swollen neck glands, pain with swallowing, or white areas in the back of your throat. These may be signs of strep throat. SEEK IMMEDIATE MEDICAL CARE IF:   You have a fever.  You develop severe or persistent headache, ear pain, sinus pain, or chest pain.  You develop wheezing, a  prolonged cough, cough up blood, or have a change in your usual mucus (if you have chronic lung disease).  You develop sore muscles or a stiff neck. Document Released: 10/04/2000 Document Revised: 07/03/2011 Document Reviewed: 08/12/2010 [facility] Patient Information 2014 [place], [place].

## 2014-03-26 NOTE — Progress Notes (Signed)
Pre visit review using our clinic review tool, if applicable. No additional management support is needed unless otherwise documented below in the visit note. 

## 2014-03-27 ENCOUNTER — Telehealth: Payer: Self-pay | Admitting: Nurse Practitioner

## 2014-03-27 NOTE — Telephone Encounter (Signed)
emmi emailed °

## 2014-04-06 ENCOUNTER — Ambulatory Visit (INDEPENDENT_AMBULATORY_CARE_PROVIDER_SITE_OTHER): Payer: Commercial Managed Care - HMO | Admitting: Nurse Practitioner

## 2014-04-06 ENCOUNTER — Encounter: Payer: Self-pay | Admitting: Nurse Practitioner

## 2014-04-06 VITALS — BP 132/85 | HR 80 | Temp 98.1°F | Ht 66.5 in | Wt 167.0 lb

## 2014-04-06 DIAGNOSIS — Z720 Tobacco use: Secondary | ICD-10-CM

## 2014-04-06 DIAGNOSIS — I7300 Raynaud's syndrome without gangrene: Secondary | ICD-10-CM

## 2014-04-06 DIAGNOSIS — M349 Systemic sclerosis, unspecified: Secondary | ICD-10-CM

## 2014-04-06 DIAGNOSIS — Z716 Tobacco abuse counseling: Secondary | ICD-10-CM

## 2014-04-06 DIAGNOSIS — I10 Essential (primary) hypertension: Secondary | ICD-10-CM

## 2014-04-06 MED ORDER — VARENICLINE TARTRATE 0.5 MG X 11 & 1 MG X 42 PO MISC: ORAL | Status: DC

## 2014-04-06 NOTE — Patient Instructions (Signed)
Start chantix 1 week before quit date. See me in 1 month for continuation.  Continue blood pressure medicine.  Please get chest xray at your convenience at [facility] in [place] for surveillance of scleroderma.  Please schedule appointment with Dr Trudie Reed.  Great job with walking!  Nice to see you!

## 2014-04-06 NOTE — Assessment & Plan Note (Signed)
At goal w/amlodipine 5 mg qd. Raynaud's improved No intolerable SE Continue at same dose.

## 2014-04-06 NOTE — Progress Notes (Signed)
Subjective:     Brittney Myers is a 58 y.o. female presents for f/u of : 1. Encounter for smoking cessation counseling : wants to start chantix. Used twice in past-1st time had nightnmares, but also in difficult social situation. 2nd time had no problem. Smokes about 10 cigs daily. Additional RF for lung disease: scleroderma-uncertain if lung involvement.  2. Scleroderma : tight skin on hands, & face. Decreased ROM hands. Lost to f/u w/Dr Trudie Reed. Had CXR & 2-d echo of heart.Taking naproxyn most days for joint pain.   3. Essential hypertension: at goal since adding 5 mg amlodipine. No intolerable SE.   4. Raynaud's disease :improved since adding amlodipine   The following portions of the patient's history were reviewed and updated as appropriate: allergies, current medications, past medical history, past social history, past surgical history and problem list.  Review of Systems Constitutional: negative for chills, fevers and improved energy since started walking-lost 3 pounds! Respiratory: negative for cough and dyspnea on exertion Cardiovascular: negative for chest pressure/discomfort, irregular heart beat and lower extremity edema Integument/breast: positive for tight skin & "broken vessels across face" Musculoskeletal:positive for arthralgias Behavioral/Psych: positive for tobacco use    Objective:    BP 132/85 mmHg  Pulse 80  Temp(Src) 98.1 F (36.7 C) (Temporal)  Ht 5' 6.5" (1.689 m)  Wt 167 lb (75.751 kg)  BMI 26.55 kg/m2  SpO2 95% BP 132/85 mmHg  Pulse 80  Temp(Src) 98.1 F (36.7 C) (Temporal)  Ht 5' 6.5" (1.689 m)  Wt 167 lb (75.751 kg)  BMI 26.55 kg/m2  SpO2 95% General appearance: alert, cooperative, appears stated age and no distress Head: Normocephalic, without obvious abnormality, atraumatic Eyes: negative findings: lids and lashes normal and conjunctivae and sclerae normal Lungs: diminished breath sounds throughout Heart: regular rate and rhythm, S1, S2 normal, no  murmur, click, rub or gallop Extremities: no edema LE, incomplete flexion fingers Skin: telangiectasia cheeks, skin drawn around mouth, tight on fingers & hands, papules forearms    Assessment:Plan   1. Encounter for smoking cessation counseling - varenicline (CHANTIX STARTING MONTH PAK) 0.5 MG X 11 & 1 MG X 42 tablet; Take 1 0.5 mg T PO  Qd X 3 d, then increase to 1 0.5 mg T BID X 4 d, then increase to one 1 mg tablet twice daily.  Dispense: 53 tablet; Refill: 0  2. Scleroderma - DG Chest 2 View; Future  3. Essential hypertension  4. Raynaud's disease  See problem list for complete A&P See pt instructions. F/u 1 mo-continue chantix

## 2014-04-06 NOTE — Assessment & Plan Note (Signed)
Tight skin face & hands Lost to f/u w/rheum recmd she returns for best care Order CXR for lung involvement surveillance Using naproxen for joint pain

## 2014-04-06 NOTE — Progress Notes (Signed)
Pre visit review using our clinic review tool, if applicable. No additional management support is needed unless otherwise documented below in the visit note. 

## 2014-04-06 NOTE — Assessment & Plan Note (Signed)
Select quit date Start chantix 1 week before quit date 1st time she used med she had nightmares, but also had some social change. No SE 2 nd time she used it. Wants to restart. F/u 1 month.

## 2014-04-06 NOTE — Assessment & Plan Note (Signed)
Warm hands today-improvement since starting amlodipine.

## 2014-04-09 ENCOUNTER — Telehealth: Payer: Self-pay | Admitting: Nurse Practitioner

## 2014-04-09 ENCOUNTER — Ambulatory Visit (INDEPENDENT_AMBULATORY_CARE_PROVIDER_SITE_OTHER): Payer: Commercial Managed Care - HMO

## 2014-04-09 DIAGNOSIS — Z720 Tobacco use: Secondary | ICD-10-CM

## 2014-04-09 DIAGNOSIS — M349 Systemic sclerosis, unspecified: Secondary | ICD-10-CM

## 2014-04-09 NOTE — Telephone Encounter (Signed)
Patient returned call and was given results.  

## 2014-04-09 NOTE — Telephone Encounter (Signed)
pls call pt: Advise GREAT news CXR shows no signs of lung involvement from scleroderma.  She does have mild hyperinflation consistent w/COPD from smoking.

## 2014-04-09 NOTE — Telephone Encounter (Signed)
LMOVM for pt to return concerning results.

## 2014-05-06 ENCOUNTER — Ambulatory Visit (INDEPENDENT_AMBULATORY_CARE_PROVIDER_SITE_OTHER): Payer: Commercial Managed Care - HMO | Admitting: Nurse Practitioner

## 2014-05-06 ENCOUNTER — Encounter: Payer: Self-pay | Admitting: Nurse Practitioner

## 2014-05-06 VITALS — BP 120/74 | HR 82 | Temp 98.0°F | Ht 66.5 in | Wt 170.0 lb

## 2014-05-06 DIAGNOSIS — J0110 Acute frontal sinusitis, unspecified: Secondary | ICD-10-CM

## 2014-05-06 DIAGNOSIS — Z720 Tobacco use: Secondary | ICD-10-CM

## 2014-05-06 DIAGNOSIS — Z716 Tobacco abuse counseling: Secondary | ICD-10-CM

## 2014-05-06 MED ORDER — GUAIFENESIN ER 600 MG PO TB12: 600.0000 mg | ORAL_TABLET | Freq: Two times a day (BID) | ORAL | Status: DC

## 2014-05-06 MED ORDER — AZITHROMYCIN 250 MG PO TABS: ORAL_TABLET | ORAL | Status: DC

## 2014-05-06 MED ORDER — VARENICLINE TARTRATE 1 MG PO TABS: 1.0000 mg | ORAL_TABLET | Freq: Two times a day (BID) | ORAL | Status: DC

## 2014-05-06 NOTE — Progress Notes (Signed)
Subjective:     Brittney Myers is a 59 y.o. female presents for f/u of smoking cessation management & has new c/o nasal congestion & cough X 2 weeks. Smoke: Startes chantix 4 weeks ago. Quit smoking 8 days ago. No intolerable SE to meds-makes her a little tired. URI: blowing & coughing green sputum. Cough keeps awake at night. Denies fever, chest burns w/cough. Feels fatigued. Sick family member at home.  The following portions of the patient's history were reviewed and updated as appropriate: allergies, current medications, past medical history, past social history, past surgical history and problem list.  Review of Systems Ears, nose, mouth, throat, and face: positive for ears popping, negative for sore throat Respiratory: negative for wheezing Cardiovascular: negative for palpitations Gastrointestinal: negative for abdominal pain, change in bowel habits, diarrhea, nausea and vomiting Neurological: negative for headaches and memory problems Behavioral/Psych: negative for aggressive behavior, bad mood, depression and irritability    Objective:    BP 120/74 mmHg  Pulse 82  Temp(Src) 98 F (36.7 C) (Oral)  Ht 5' 6.5" (1.689 m)  Wt 170 lb (77.111 kg)  BMI 27.03 kg/m2  SpO2 96% BP 120/74 mmHg  Pulse 82  Temp(Src) 98 F (36.7 C) (Oral)  Ht 5' 6.5" (1.689 m)  Wt 170 lb (77.111 kg)  BMI 27.03 kg/m2  SpO2 96% General appearance: alert, cooperative, appears stated age and no distress Head: Normocephalic, without obvious abnormality, atraumatic Eyes: negative findings: lids and lashes normal and conjunctivae and sclerae normal Ears: normal TM's and external ear canals both ears Throat: lips, mucosa, and tongue normal; teeth and gums normal and missing teeth Neck: no adenopathy, supple, symmetrical, trachea midline and thyroid not enlarged, symmetric, no tenderness/mass/nodules Lungs: clear to auscultation bilaterally Heart: regular rate and rhythm, S1, S2 normal, no murmur, click, rub  or gallop    Assessment:plan   1. Acute frontal sinusitis, recurrence not specified cough - azithromycin (ZITHROMAX) 250 MG tablet; Take 2 T po day1, 1T po days 2-5.  Dispense: 6 tablet; Refill: 0 - guaiFENesin (MUCINEX) 600 MG 12 hr tablet; Take 1 tablet (600 mg total) by mouth 2 (two) times daily.  Dispense: 30 tablet; Refill: 0 Sinus rinses F/u PRN 2. Encounter for smoking cessation counseling - varenicline (CHANTIX) 1 MG tablet; Take 1 tablet (1 mg total) by mouth every 12 (twelve) hours.  Dispense: 60 tablet; Refill: 1 F/u 8 weeks to determine if wants to continue for 12 more weeks.

## 2014-05-06 NOTE — Patient Instructions (Addendum)
Continue chantix twice daily.  Start antibiotic. Eat yogurt daily to help prevent antibiotic -associated diarrhea  Continue sinus rinses daily-add pinch baking soda to saline packs to buffer. Use daily for about 1 week or until runs clear & breathing better.  Start mucinex.  Take cough syrup at night-delsym.  Let me know if not feeling better in 10 days or if you start to run fever.  See you in 8 weeks to determine if we want to continue chantix for another 12 weeks.

## 2014-05-06 NOTE — Progress Notes (Signed)
Pre visit review using our clinic review tool, if applicable. No additional management support is needed unless otherwise documented below in the visit note. 

## 2014-05-07 ENCOUNTER — Telehealth: Payer: Self-pay | Admitting: Nurse Practitioner

## 2014-05-07 NOTE — Telephone Encounter (Signed)
emmi emailed °

## 2014-06-02 ENCOUNTER — Other Ambulatory Visit: Payer: Self-pay | Admitting: *Deleted

## 2014-06-02 DIAGNOSIS — I10 Essential (primary) hypertension: Secondary | ICD-10-CM

## 2014-06-02 MED ORDER — AMLODIPINE BESYLATE 5 MG PO TABS: 5.0000 mg | ORAL_TABLET | Freq: Every day | ORAL | Status: DC

## 2014-06-23 ENCOUNTER — Other Ambulatory Visit: Payer: Self-pay | Admitting: Nurse Practitioner

## 2014-06-23 ENCOUNTER — Telehealth: Payer: Self-pay | Admitting: *Deleted

## 2014-06-23 DIAGNOSIS — Z20828 Contact with and (suspected) exposure to other viral communicable diseases: Secondary | ICD-10-CM

## 2014-06-23 MED ORDER — OSELTAMIVIR PHOSPHATE 75 MG PO CAPS: 75.0000 mg | ORAL_CAPSULE | Freq: Every day | ORAL | Status: DC

## 2014-06-23 NOTE — Telephone Encounter (Signed)
Patient stated that her grandson has the flu. She would like the tamiflu rx.

## 2014-06-23 NOTE — Telephone Encounter (Signed)
Patient wanted to know if you know what med to take for flu preventative? Please advise?

## 2014-06-23 NOTE — Telephone Encounter (Signed)
LMOVM for pt to return call 

## 2014-06-23 NOTE — Telephone Encounter (Signed)
Sent!

## 2014-06-23 NOTE — Telephone Encounter (Signed)
If she lives with a family member with flu, I can call in tamiflu for prevention. She should know that tamiflu can cause mood changes including suicidal ideation, headache, and stomach cramping.

## 2014-06-23 NOTE — Telephone Encounter (Signed)
Patient notified

## 2014-07-03 ENCOUNTER — Ambulatory Visit (INDEPENDENT_AMBULATORY_CARE_PROVIDER_SITE_OTHER): Payer: Commercial Managed Care - HMO | Admitting: Nurse Practitioner

## 2014-07-03 ENCOUNTER — Encounter: Payer: Self-pay | Admitting: Nurse Practitioner

## 2014-07-03 VITALS — BP 133/74 | HR 61 | Temp 97.5°F | Ht 66.0 in | Wt 181.0 lb

## 2014-07-03 DIAGNOSIS — I10 Essential (primary) hypertension: Secondary | ICD-10-CM

## 2014-07-03 DIAGNOSIS — Z720 Tobacco use: Secondary | ICD-10-CM | POA: Diagnosis not present

## 2014-07-03 DIAGNOSIS — Z716 Tobacco abuse counseling: Secondary | ICD-10-CM | POA: Diagnosis not present

## 2014-07-03 MED ORDER — AMLODIPINE BESYLATE 5 MG PO TABS: 5.0000 mg | ORAL_TABLET | Freq: Every day | ORAL | Status: DC

## 2014-07-03 NOTE — Patient Instructions (Signed)
You look [place]!!!!  Super job with exercise & diet changes & SMOKING CESSATION!!!!  Keep up the good work!  I need to see you for a physical to check cholesterol, kidney & liver. Pls schedule at your convenience.  Blood pressure looks great!

## 2014-07-03 NOTE — Progress Notes (Signed)
Pre visit review using our clinic review tool, if applicable. No additional management support is needed unless otherwise documented below in the visit note. 

## 2014-07-09 ENCOUNTER — Encounter: Payer: Self-pay | Admitting: Nurse Practitioner

## 2014-07-09 NOTE — Assessment & Plan Note (Signed)
Continue chantix F/u 8 wks

## 2014-07-09 NOTE — Progress Notes (Signed)
Subjective:     Brittney Myers is a 59 y.o. female here for f/u of smoking cessation & HTN. She started chantix 4 wks ago. She stopped smoking within 1st 2 weeks of starting med. She feels well, but has gained 11 lbs. She is perplexed by wt gain as she has started exercising a Y 3 days/week, cut out soda, & is eating more fruit.  She expresses some situational frustration over living w/family who smoke-this is temptation, but she is dealing with it by going to another room in house. She wishes to continue on chantix for another 4 to 8 weeks. She is tolerating without SE.  HTN: well controlled. No SE to norvasc The following portions of the patient's history were reviewed and updated as appropriate: allergies, current medications, past medical history, past social history, past surgical history and problem list.  Review of Systems Constitutional: negative for fatigue and fevers Respiratory: negative for cough, sputum and wheezing Cardiovascular: negative for irregular heart beat Neurological: negative for headaches Behavioral/Psych: negative for anxiety, irritability, sleep disturbance and tobacco use    Objective:    BP 133/74 mmHg  Pulse 61  Temp(Src) 97.5 F (36.4 C) (Temporal)  Ht 5\' 6"  (1.676 m)  Wt 181 lb (82.101 kg)  BMI 29.23 kg/m2  SpO2 95% General appearance: alert, cooperative, appears stated age and no distress Head: Normocephalic, without obvious abnormality, atraumatic Eyes: negative findings: lids and lashes normal and conjunctivae and sclerae normal Lungs: clear to auscultation bilaterally Heart: regular rate and rhythm, S1, S2 normal, no murmur, click, rub or gallop Neurologic: Grossly normal    Assessment:Plan     1. Essential hypertension, stable continue - amLODipine (NORVASC) 5 MG tablet; Take 1 tablet (5 mg total) by mouth daily.  Dispense: 30 tablet; Refill: 5  2. Encounter for smoking cessation counseling Continue chantix.  F/u 8 weeks CPE, chantix

## 2014-07-29 ENCOUNTER — Other Ambulatory Visit: Payer: Self-pay | Admitting: Family Medicine

## 2014-08-06 ENCOUNTER — Other Ambulatory Visit: Payer: Self-pay | Admitting: Nurse Practitioner

## 2014-08-06 DIAGNOSIS — H5213 Myopia, bilateral: Secondary | ICD-10-CM | POA: Diagnosis not present

## 2014-08-06 DIAGNOSIS — H5210 Myopia, unspecified eye: Secondary | ICD-10-CM | POA: Diagnosis not present

## 2014-08-06 DIAGNOSIS — Z0000 Encounter for general adult medical examination without abnormal findings: Secondary | ICD-10-CM

## 2014-08-06 NOTE — Progress Notes (Signed)
LMOVM asking patient to CB 

## 2014-08-14 NOTE — Op Note (Signed)
PATIENT NAME:  Brittney Myers, Brittney Myers MR#:  [id number] DATE OF BIRTH:  11-26-1955  DATE OF PROCEDURE:  12/27/2012  PREOPERATIVE DIAGNOSIS:  Right breast mass.  POSTOPERATIVE DIAGNOSIS:  Right breast mass.   OPERATION:  Excision right breast mass with wire localization.   SURGEON:  Jamal Collin, MD.   ANESTHESIA: General.   COMPLICATIONS: None.   ESTIMATED BLOOD LOSS: Less than 10 mL.   DRAINS: None.   DESCRIPTION OF PROCEDURE: The patient underwent wire localization of a mass in the outer aspect of the right breast with mammography. She was brought to the operating room and put to sleep in the supine position on the operating table and LMA was utilized. The right breast was prepped and draped out as a sterile field. The wire entrance was located directly over the region of the mass at about the 9 o'clock position. A curved incision along the outer aspect of going through the wire entrance site was made and deepened through the layers down to the breast tissue. The skin and subcutaneous tissue was elevated on both sides with cautery. With the wire as a guide, a core of tissue surrounding the wire was excised out and noted to contain a firm nodular area in the middle.  Specimen mammogram was obtained showing the presence of a clip that was placed on a previous biopsy and also the presence of the mass within the tissue. The rest of the glandular tissue appeared normal. After ensuring hemostasis, the deeper tissue was closed with 2-0 Vicryl. The skin with subcuticular 4-0 Vicryl, covered with Dermabond. The procedure was well tolerated. She will return to the recovery room after in stable condition.    ____________________________ S.Robinette Haines, MD sgs:ce D: 12/27/2012 16:52:52 ET T: 12/27/2012 19:08:31 ET JOB#: [id number]  cc: S.G. Jamal Collin, MD, <Dictator> [facility] Robinette Haines MD ELECTRONICALLY SIGNED 12/28/2012 6:42

## 2014-08-26 ENCOUNTER — Other Ambulatory Visit (INDEPENDENT_AMBULATORY_CARE_PROVIDER_SITE_OTHER): Payer: Commercial Managed Care - HMO

## 2014-08-26 DIAGNOSIS — Z0000 Encounter for general adult medical examination without abnormal findings: Secondary | ICD-10-CM | POA: Diagnosis not present

## 2014-08-26 DIAGNOSIS — E559 Vitamin D deficiency, unspecified: Secondary | ICD-10-CM | POA: Diagnosis not present

## 2014-08-26 DIAGNOSIS — I10 Essential (primary) hypertension: Secondary | ICD-10-CM | POA: Diagnosis not present

## 2014-08-26 LAB — CBC WITH DIFFERENTIAL/PLATELET
Basophils Absolute: 0 K/uL (ref 0.0–0.1)
Basophils Relative: 0.5 % (ref 0.0–3.0)
Eosinophils Absolute: 0.2 K/uL (ref 0.0–0.7)
Eosinophils Relative: 3.6 % (ref 0.0–5.0)
HCT: 39.4 % (ref 36.0–46.0)
Hemoglobin: 13.5 g/dL (ref 12.0–15.0)
Lymphocytes Relative: 30.1 % (ref 12.0–46.0)
Lymphs Abs: 1.3 K/uL (ref 0.7–4.0)
MCHC: 34.2 g/dL (ref 30.0–36.0)
MCV: 88.4 fl (ref 78.0–100.0)
Monocytes Absolute: 0.3 K/uL (ref 0.1–1.0)
Monocytes Relative: 7.2 % (ref 3.0–12.0)
Neutro Abs: 2.5 K/uL (ref 1.4–7.7)
Neutrophils Relative %: 58.6 % (ref 43.0–77.0)
Platelets: 344 K/uL (ref 150.0–400.0)
RBC: 4.46 Mil/uL (ref 3.87–5.11)
RDW: 13.4 % (ref 11.5–15.5)
WBC: 4.3 K/uL (ref 4.0–10.5)

## 2014-08-26 LAB — COMPREHENSIVE METABOLIC PANEL
ALBUMIN: 4.4 g/dL (ref 3.5–5.2)
ALT: 16 U/L (ref 0–35)
AST: 17 U/L (ref 0–37)
Alkaline Phosphatase: 114 U/L (ref 39–117)
BUN: 13 mg/dL (ref 6–23)
CALCIUM: 9.7 mg/dL (ref 8.4–10.5)
CHLORIDE: 106 meq/L (ref 96–112)
CO2: 29 mEq/L (ref 19–32)
Creatinine, Ser: 0.71 mg/dL (ref 0.40–1.20)
GFR: 90.21 mL/min (ref >60.00)
GLUCOSE: 96 mg/dL (ref 70–99)
POTASSIUM: 4.6 meq/L (ref 3.5–5.1)
Sodium: 140 mEq/L (ref 135–145)
TOTAL PROTEIN: 7.4 g/dL (ref 6.0–8.3)
Total Bilirubin: 0.4 mg/dL (ref 0.2–1.2)

## 2014-08-26 LAB — LIPID PANEL
Cholesterol: 206 mg/dL — ABNORMAL HIGH (ref 0–200)
HDL: 66.1 mg/dL
LDL Cholesterol: 124 mg/dL — ABNORMAL HIGH (ref 0–99)
NonHDL: 139.9
Total CHOL/HDL Ratio: 3
Triglycerides: 80 mg/dL (ref 0.0–149.0)
VLDL: 16 mg/dL (ref 0.0–40.0)

## 2014-08-26 LAB — T4, FREE: Free T4: 0.88 ng/dL (ref 0.60–1.60)

## 2014-08-26 LAB — HEMOGLOBIN A1C: Hgb A1c MFr Bld: 5.4 % (ref 4.6–6.5)

## 2014-08-26 LAB — VITAMIN B12: VITAMIN B 12: 458 pg/mL (ref 211–911)

## 2014-08-26 LAB — VITAMIN D 25 HYDROXY (VIT D DEFICIENCY, FRACTURES): VITD: 15.94 ng/mL — ABNORMAL LOW (ref 30.00–100.00)

## 2014-08-26 LAB — TSH: TSH: 1.09 u[IU]/mL (ref 0.35–4.50)

## 2014-08-27 LAB — HIV ANTIBODY (ROUTINE TESTING W REFLEX): HIV 1&2 Ab, 4th Generation: NONREACTIVE

## 2014-08-31 ENCOUNTER — Ambulatory Visit (INDEPENDENT_AMBULATORY_CARE_PROVIDER_SITE_OTHER): Payer: Commercial Managed Care - HMO | Admitting: Nurse Practitioner

## 2014-08-31 ENCOUNTER — Encounter: Payer: Self-pay | Admitting: Nurse Practitioner

## 2014-08-31 VITALS — BP 122/74 | HR 62 | Temp 98.0°F | Ht 66.0 in | Wt 184.0 lb

## 2014-08-31 DIAGNOSIS — M349 Systemic sclerosis, unspecified: Secondary | ICD-10-CM

## 2014-08-31 DIAGNOSIS — Z79899 Other long term (current) drug therapy: Secondary | ICD-10-CM | POA: Insufficient documentation

## 2014-08-31 DIAGNOSIS — E559 Vitamin D deficiency, unspecified: Secondary | ICD-10-CM | POA: Diagnosis not present

## 2014-08-31 DIAGNOSIS — M25561 Pain in right knee: Secondary | ICD-10-CM | POA: Diagnosis not present

## 2014-08-31 DIAGNOSIS — M5441 Lumbago with sciatica, right side: Secondary | ICD-10-CM

## 2014-08-31 DIAGNOSIS — Z0000 Encounter for general adult medical examination without abnormal findings: Secondary | ICD-10-CM

## 2014-08-31 LAB — MICROALBUMIN / CREATININE URINE RATIO
Creatinine,U: 140.7 mg/dL
Microalb Creat Ratio: 1.1 mg/g (ref 0.0–30.0)
Microalb, Ur: 1.6 mg/dL (ref 0.0–1.9)

## 2014-08-31 LAB — URINALYSIS, MICROSCOPIC ONLY

## 2014-08-31 MED ORDER — NAPROXEN 250 MG PO TABS: 250.0000 mg | ORAL_TABLET | Freq: Two times a day (BID) | ORAL | Status: DC | PRN

## 2014-08-31 MED ORDER — VITAMIN D3 1.25 MG (50000 UT) PO CAPS: 1.0000 | ORAL_CAPSULE | ORAL | Status: DC

## 2014-08-31 NOTE — Progress Notes (Signed)
Subjective:     Brittney Myers is a 59 y.o. female and is here for a comprehensive physical exam. We discussed labs. The patient reports problems - R knee pain, L 5th toe pain, R-sided back pain . She has Hx of limited scleroderma-was followed by Dr Trudie Reed, has not seen her in several yrs. Tight skin at hands & face obvious on exam. Last CXR 2015 wa nml. BUN & creat NMl. Labs/preventive care: vit d def-discussed implications for D replacement. Lipid panel good: ,3% likely to develop ASCVD in 10 yrs. All other labs NMl. She is enjoying being tobacco free-quit smoking 5 mos ago using chantix. Had R breast Bx 2014. She is due for MMG this year. Pt reports no changes in breast tissue. R knee pain: Started 2 weeks ago after she started limping due to L 5th toe injury. Knee is swollen. Naproxyn helps. L 5th toe pain: "stumped toe 2 weeks ago". Pain w/weight bearing. No intervention.  R-sided back pain: long-standing. Reports scoliosis. Right side lumbar w/radiation into R leg & foot. Numbness present often. Activity sometimes makes worse.  Denies bowel or bladder incontinence.Naproxyn helps.  History   Social History  . Marital Status: Divorced    Spouse Name: N/A  . Number of Children: 3  . Years of Education: N/A   Occupational History  . Not on file.   Social History Main Topics  . Smoking status: Former Smoker -- 0.50 packs/day for 30 years    Types: Cigarettes    Start date: 04/24/1973    Quit date: 08/18/2013  . Smokeless tobacco: Never Used  . Alcohol Use: No  . Drug Use: No  . Sexual Activity: No   Other Topics Concern  . Not on file   Social History Narrative   Lives w/ daughter.  3 grown children, 10 grandchildren   Granddaughter passed away at age 39yo from bacterial meningitis   Occupation: was Chief Executive Officer, on disability for arthritis and scleroderma - watches grandchildren   Edu: Associate's degree   Activity:walks reg   Diet: good water, fruits/vegetables daily    Health Maintenance  Topic Date Due  . INFLUENZA VACCINE  11/23/2014  . MAMMOGRAM  11/21/2015  . COLONOSCOPY  11/11/2017  . TETANUS/TDAP  04/24/2020  . HIV Screening  Completed    The following portions of the patient's history were reviewed and updated as appropriate: allergies, current medications, past medical history, past social history, past surgical history and problem list.  Review of Systems Constitutional: negative for fatigue and fevers Eyes: positive for contacts/glasses, negative for visual disturbance Respiratory: negative for cough Cardiovascular: negative for chest pressure/discomfort, lower extremity edema and palpitations Gastrointestinal: negative for abdominal pain, change in bowel habits, constipation, diarrhea and dyspepsia Genitourinary:negative, Hx of TAH Integument/breast: positive for mole on R neck & back-reports no change in years Neurological: negative for headaches and weakness Behavioral/Psych: negative for sleep disturbance   Objective:    BP 122/74 mmHg  Pulse 62  Temp(Src) 98 F (36.7 C) (Oral)  Ht 5\' 6"  (1.676 m)  Wt 184 lb (83.462 kg)  BMI 29.71 kg/m2  SpO2 98% General appearance: alert, cooperative, appears stated age and no distress Head: Normocephalic, without obvious abnormality, atraumatic Eyes: negative findings: lids and lashes normal, conjunctivae and sclerae normal, corneas clear and pupils equal, round, reactive to light and accomodation Ears: normal TM's and external ear canals both ears Throat: abnormal findings: missing few teeth-reports recent dental exam Neck: no adenopathy, no carotid bruit, supple, symmetrical, trachea  midline and thyroid not enlarged, symmetric, no tenderness/mass/nodules Back: scoliosis, slight limitation in ROM w/forward & lateral flexion. Point tenderness over lumbar spine & bilat SI joints Lungs: clear to auscultation bilaterally Heart: regular rate and rhythm, S1, S2 normal, no murmur, click, rub or  gallop Abdomen: soft, non-tender; bowel sounds normal; no masses,  no organomegaly Extremities: extremities normal, atraumatic, no cyanosis or edema and tight skin to pip joints. Not able to make complete fist  R knee: warm, mild effusion L foot: ecchymoses base of 5th toe, tender to palpate over bruised area. No tender areas along 5th metatarsal Pulses: 2+ and symmetric Skin: 1 cm light brown nevus R neck, some color variation. flesh toned nevus mid back adjacent to seborrheic keratosis. Ea about 4 mm Lymph nodes: Cervical, supraclavicular, and axillary nodes normal. Neurologic: Grossly normal    Assessment:Plan   1. Preventative health care Needs MMG this summer. - Microalbumin / creatinine urine ratio - Urine Microscopic  2. Vitamin D deficiency start - Cholecalciferol (VITAMIN D3) 50000 UNITS CAPS; Take 1 capsule by mouth every 7 (seven) days.  Dispense: 12 capsule; Refill: 0  3. Right-sided low back pain with right-sided sciatica Start naproxen 250 mg bid - Ambulatory referral to Spine Surgery  4. Scleroderma, limited - Ambulatory referral to Rheumatology  5. Knee pain, acute, right Warm swollen, mild effision Wear knee sleeve.  start - naproxen (NAPROSYN) 250 MG tablet; Take 1 tablet (250 mg total) by mouth 2 (two) times daily as needed.  Dispense: 60 tablet; Refill: 1  F/u 2 weeks, consider steroid injection

## 2014-08-31 NOTE — Progress Notes (Signed)
Pre visit review using our clinic review tool, if applicable. No additional management support is needed unless otherwise documented below in the visit note. 

## 2014-08-31 NOTE — Patient Instructions (Signed)
Please start Vitamin D prescription.  Please see [facility] for back pain. Please see rheumatology for ongoing management of scleroderma.  I'm so happy you are not smoking!!!!  Tape 4th & 5th toes together for a few weeks to minimize toe movement.  Take naproxsyn twice daily for about 10 days or until I see you again.  See me in 10 days, if knee is still painful & warm, we can plan to do steroid injection. Nice to see you!

## 2014-09-01 ENCOUNTER — Telehealth: Payer: Self-pay | Admitting: Nurse Practitioner

## 2014-09-01 NOTE — Telephone Encounter (Signed)
LMOVM-identified about lab results. Patient to call back with any questions or concerns.

## 2014-09-01 NOTE — Telephone Encounter (Signed)
pls call pt: Advise Urine results nml.

## 2014-09-10 ENCOUNTER — Ambulatory Visit: Payer: Commercial Managed Care - HMO | Admitting: Nurse Practitioner

## 2014-09-11 ENCOUNTER — Encounter: Payer: Self-pay | Admitting: Nurse Practitioner

## 2014-09-29 ENCOUNTER — Telehealth: Payer: Self-pay | Admitting: *Deleted

## 2014-09-29 NOTE — Telephone Encounter (Signed)
Brittney Myers from [facility] spine and specialist called to let Dr. Anitra Lauth know that they did schedule pt an apt for 09/30/14 and pt called and cancelled apt. She stated that they will keep there referral for 30 days but after that it will be deleted.

## 2014-11-06 ENCOUNTER — Other Ambulatory Visit: Payer: Self-pay | Admitting: Family Medicine

## 2014-11-06 DIAGNOSIS — I10 Essential (primary) hypertension: Secondary | ICD-10-CM

## 2014-11-06 MED ORDER — AMLODIPINE BESYLATE 5 MG PO TABS: 5.0000 mg | ORAL_TABLET | Freq: Every day | ORAL | Status: DC

## 2014-11-06 NOTE — Telephone Encounter (Signed)
Pt's pharmacy requesting 90 day Rx of amlodipine.  Please advise.

## 2014-12-09 DIAGNOSIS — I10 Essential (primary) hypertension: Secondary | ICD-10-CM | POA: Diagnosis not present

## 2015-01-01 DIAGNOSIS — Z0100 Encounter for examination of eyes and vision without abnormal findings: Secondary | ICD-10-CM | POA: Diagnosis not present

## 2015-01-08 ENCOUNTER — Other Ambulatory Visit: Payer: Self-pay

## 2015-01-08 DIAGNOSIS — Z1231 Encounter for screening mammogram for malignant neoplasm of breast: Secondary | ICD-10-CM

## 2015-01-19 ENCOUNTER — Ambulatory Visit
Admission: RE | Admit: 2015-01-19 | Discharge: 2015-01-19 | Disposition: A | Discharge status: Home / Self Care | Payer: Commercial Managed Care - HMO | Source: Ambulatory Visit

## 2015-01-19 DIAGNOSIS — Z1231 Encounter for screening mammogram for malignant neoplasm of breast: Secondary | ICD-10-CM | POA: Diagnosis not present

## 2015-03-04 ENCOUNTER — Ambulatory Visit (INDEPENDENT_AMBULATORY_CARE_PROVIDER_SITE_OTHER): Payer: Commercial Managed Care - HMO | Admitting: Family Medicine

## 2015-03-04 ENCOUNTER — Encounter: Payer: Self-pay | Admitting: Family Medicine

## 2015-03-04 VITALS — BP 137/86 | HR 72 | Temp 98.5°F | Resp 20 | Wt 189.0 lb

## 2015-03-04 DIAGNOSIS — J320 Chronic maxillary sinusitis: Secondary | ICD-10-CM | POA: Insufficient documentation

## 2015-03-04 DIAGNOSIS — J0100 Acute maxillary sinusitis, unspecified: Secondary | ICD-10-CM | POA: Diagnosis not present

## 2015-03-04 MED ORDER — PREDNISONE 50 MG PO TABS: 50.0000 mg | ORAL_TABLET | Freq: Every day | ORAL | Status: DC

## 2015-03-04 MED ORDER — DOXYCYCLINE HYCLATE 100 MG PO TABS: 100.0000 mg | ORAL_TABLET | Freq: Two times a day (BID) | ORAL | Status: DC

## 2015-03-04 MED ORDER — ALBUTEROL SULFATE HFA 108 (90 BASE) MCG/ACT IN AERS: 2.0000 | INHALATION_SPRAY | Freq: Four times a day (QID) | RESPIRATORY_TRACT | Status: DC | PRN

## 2015-03-04 NOTE — Patient Instructions (Signed)
Sinusitis, Adult Sinusitis is redness, soreness, and inflammation of the paranasal sinuses. Paranasal sinuses are air pockets within the bones of your face. They are located beneath your eyes, in the middle of your forehead, and above your eyes. In healthy paranasal sinuses, mucus is able to drain out, and air is able to circulate through them by way of your nose. However, when your paranasal sinuses are inflamed, mucus and air can become trapped. This can allow bacteria and other germs to grow and cause infection. Sinusitis can develop quickly and last only a short time (acute) or continue over a long period (chronic). Sinusitis that lasts for more than 12 weeks is considered chronic. CAUSES Causes of sinusitis include:  Allergies.  Structural abnormalities, such as displacement of the cartilage that separates your nostrils (deviated septum), which can decrease the air flow through your nose and sinuses and affect sinus drainage.  Functional abnormalities, such as when the small hairs (cilia) that line your sinuses and help remove mucus do not work properly or are not present. SIGNS AND SYMPTOMS Symptoms of acute and chronic sinusitis are the same. The primary symptoms are pain and pressure around the affected sinuses. Other symptoms include:  Upper toothache.  Earache.  Headache.  Bad breath.  Decreased sense of smell and taste.  A cough, which worsens when you are lying flat.  Fatigue.  Fever.  Thick drainage from your nose, which often is green and may contain pus (purulent).  Swelling and warmth over the affected sinuses. DIAGNOSIS Your health care provider will perform a physical exam. During your exam, your health care provider may perform any of the following to help determine if you have acute sinusitis or chronic sinusitis:  Look in your nose for signs of abnormal growths in your nostrils (nasal polyps).  Tap over the affected sinus to check for signs of  infection.  View the inside of your sinuses using an imaging device that has a light attached (endoscope). If your health care provider suspects that you have chronic sinusitis, one or more of the following tests may be recommended:  Allergy tests.  Nasal culture. A sample of mucus is taken from your nose, sent to a lab, and screened for bacteria.  Nasal cytology. A sample of mucus is taken from your nose and examined by your health care provider to determine if your sinusitis is related to an allergy. TREATMENT Most cases of acute sinusitis are related to a viral infection and will resolve on their own within 10 days. Sometimes, medicines are prescribed to help relieve symptoms of both acute and chronic sinusitis. These may include pain medicines, decongestants, nasal steroid sprays, or saline sprays. However, for sinusitis related to a bacterial infection, your health care provider will prescribe antibiotic medicines. These are medicines that will help kill the bacteria causing the infection. Rarely, sinusitis is caused by a fungal infection. In these cases, your health care provider will prescribe antifungal medicine. For some cases of chronic sinusitis, surgery is needed. Generally, these are cases in which sinusitis recurs more than 3 times per year, despite other treatments. HOME CARE INSTRUCTIONS  Drink plenty of water. Water helps thin the mucus so your sinuses can drain more easily.  Use a humidifier.  Inhale steam 3-4 times a day (for example, sit in the bathroom with the shower running).  Apply a warm, moist washcloth to your face 3-4 times a day, or as directed by your health care provider.  Use saline nasal sprays to help   moisten and clean your sinuses.  Take medicines only as directed by your health care provider.  If you were prescribed either an antibiotic or antifungal medicine, finish it all even if you start to feel better. SEEK IMMEDIATE MEDICAL CARE IF:  You have  increasing pain or severe headaches.  You have nausea, vomiting, or drowsiness.  You have swelling around your face.  You have vision problems.  You have a stiff neck.  You have difficulty breathing.   This information is not intended to replace advice given to you by your health care provider. Make sure you discuss any questions you have with your health care provider.   Document Released: 04/10/2005 Document Revised: 05/01/2014 Document Reviewed: 04/25/2011 Elsevier Interactive Patient Education 2016 [place] can use flonase and mucinex to help with symptoms.

## 2015-03-04 NOTE — Progress Notes (Signed)
   Subjective:    Patient ID: Brittney Myers, female    DOB: [birth year], 59 y.o.   MRN: [id number]  HPI  Sinus pressure: Pt presents for acute visit with a >14 d complaint of sinus pressure around eyes, frontal headache, runny  Nose, cough productive, increase fatigue secondary to unable to sleep with cough, bloody nose and decreased appetite. Pt has tried cloricedin and her netti epot without relief. She is eating and drinking well. No fever, chills, nausea, diarrhea or rash, No sick contacts. Flu shot in October.Former smoker (quit 1 year ago). H/o of emphysema.   Past Medical History  Diagnosis Date  . Arthritis   . Emphysema     spirometry 04/2010 - FVC 66%, FEV1 53%, ratio 0.63 moderately severe obstruction  . Scleroderma ([place]) 04/2010    Hawkes, low SSA, ANA +  . Scoliosis   . DDD (degenerative disc disease), lumbar   . Smoker   . Raynaud's disease     hands  . Inflammatory polyarthritis ([place])   . Colon polyp 2014    [facility] Plan)   Allergies  Allergen Reactions  . Codeine Other (See Comments)    Hallucinations  . Penicillins Rash    Review of Systems Negative, with the exception of above mentioned in HPI     Objective:   Physical Exam BP 137/86 mmHg  Pulse 72  Temp(Src) 98.5 F (36.9 C) (Oral)  Resp 20  Wt 189 lb (85.73 kg)  SpO2 95% Gen: Afebrile. No acute distress. Nontoxic in appearance. Well developed, well appearing female. Pleasant.  HENT: AT. [place]. Bilateral TM visualized no erythema, budging, fullness of TM. MMM. Poor dentition. Bilateral nares with erythema, dried blood, mild swelling. Throat without erythema or exudates. TTP right maxillary sinus. Hoarseness and cough present on exam.  Eyes:Pupils Equal Round Reactive to light, Extraocular movements intact,  Conjunctiva without redness, discharge or icterus. Neck/lymp/endocrine: Supple, anterior cervical lymphadenopathy present CV: RRR Chest: CTAB, no wheeze or crackles. Mildly diminished air movement. Nomral  effort.  Abd: Soft. NTND. BS present Skin: No rashes, purpura or petechiae.       Assessment & Plan:  1. Acute maxillary sinusitis, recurrence not specified - Hydrate, rest, mucinex, flonase, advil/tylenol for headache relief. Prednisone burst, abx, inhaler PRN.  - predniSONE (DELTASONE) 50 MG tablet; Take 1 tablet (50 mg total) by mouth daily with breakfast.  Dispense: 5 tablet; Refill: 0 - doxycycline (VIBRA-TABS) 100 MG tablet; Take 1 tablet (100 mg total) by mouth 2 (two) times daily.  Dispense: 20 tablet; Refill: 0 - albuterol (PROVENTIL HFA;VENTOLIN HFA) 108 (90 BASE) MCG/ACT inhaler; Inhale 2 puffs into the lungs every 6 (six) hours as needed for wheezing or shortness of breath.  Dispense: 1 Inhaler; Refill: 0 F/u PRN

## 2015-03-17 ENCOUNTER — Telehealth: Payer: Self-pay | Admitting: Family Medicine

## 2015-03-17 ENCOUNTER — Encounter: Payer: Self-pay | Admitting: Family Medicine

## 2015-03-17 ENCOUNTER — Ambulatory Visit (INDEPENDENT_AMBULATORY_CARE_PROVIDER_SITE_OTHER): Payer: Commercial Managed Care - HMO | Admitting: Family Medicine

## 2015-03-17 VITALS — BP 148/89 | HR 79 | Temp 97.5°F | Resp 20 | Wt 189.0 lb

## 2015-03-17 DIAGNOSIS — J0101 Acute recurrent maxillary sinusitis: Secondary | ICD-10-CM

## 2015-03-17 MED ORDER — LEVOCETIRIZINE DIHYDROCHLORIDE 5 MG PO TABS: 5.0000 mg | ORAL_TABLET | Freq: Every evening | ORAL | Status: DC

## 2015-03-17 MED ORDER — AZITHROMYCIN 250 MG PO TABS: ORAL_TABLET | ORAL | Status: DC

## 2015-03-17 MED ORDER — FLUTICASONE PROPIONATE 50 MCG/ACT NA SUSP: 2.0000 | Freq: Every day | NASAL | Status: DC

## 2015-03-17 NOTE — Telephone Encounter (Signed)
Pt will need to be evaluated to receive antibiotics.

## 2015-03-17 NOTE — Patient Instructions (Signed)
Flonase, antihistamine and antibiotic have been called into your pharmacy. If you symptoms worsen over the weekend please be seen in the urgent care to get xray

## 2015-03-17 NOTE — Progress Notes (Signed)
   Subjective:    Patient ID: Brittney Myers, female    DOB: 1956/03/09, 59 y.o.   MRN: [id number]  HPI  Cough: pt presents for acute OV for productive cough, cold chills, Ear and nose pain, rhinorrhea, congestion and sore throat. She presented with similar complaints 2 weeks ago. Pt states she did have good resolution after treatment last time, bu then her grandkids came home with and was sick. She denies nausea, vomit, diarrhea, rash or decreased appetite. She states she did take some of her daughters cough syrup and it helped a little (Robitussin).  She has used her albuterol inhaler today once.  Former Smoker  Past Medical History  Diagnosis Date  . Arthritis   . Emphysema     spirometry 04/2010 - FVC 66%, FEV1 53%, ratio 0.63 moderately severe obstruction  . Scleroderma ([place]) 04/2010    Hawkes, low SSA, ANA +  . Scoliosis   . DDD (degenerative disc disease), lumbar   . Smoker   . Raynaud's disease     hands  . Inflammatory polyarthritis ([place])   . Colon polyp 2014    [facility] Plan)   Allergies  Allergen Reactions  . Codeine Other (See Comments)    Hallucinations  . Penicillins Rash   Review of Systems Negative, with the exception of above mentioned in HPI    Objective:   Physical Exam BP 148/89 mmHg  Pulse 79  Temp(Src) 97.5 F (36.4 C)  Resp 20  Wt 189 lb (85.73 kg)  SpO2 98% Gen: Afebrile. No acute distress. Nontoxic in appearance. Well developed, well nourished. Female.  HENT: AT. [place]. Bilateral TM visualized full/shiny, no erythema or bulging. MMM. Bilateral nares with erythema and swelling. Throat without erythema or exudates. TTP maxillary sinus. Cough on exam. No hoarseness on exam.  Eyes:Pupils Equal Round Reactive to light, Extraocular movements intact,  Conjunctiva without redness, discharge or icterus. Neck/lymp/endocrine: Supple,mild ant cervical lymphadenopathy CV: RRR  Chest: CTAB, no wheeze or crackles     Assessment & Plan:  1. Acute recurrent maxillary  sinusitis - Sounds recurrent. She had improvement with prior therapy. Family members ill. Pt insurance does not cover cough suppressants.  - azithromycin (ZITHROMAX Z-PAK) 250 MG tablet; Take 500 mg day 1, then 250mg  until completed.  Dispense: 6 each; Refill: 0 - fluticasone (FLONASE) 50 MCG/ACT nasal spray; Place 2 sprays into both nostrils daily.  Dispense: 16 g; Refill: 6 - levocetirizine (XYZAL) 5 MG tablet; Take 1 tablet (5 mg total) by mouth every evening.  Dispense: 30 tablet; Refill: 1

## 2015-03-17 NOTE — Telephone Encounter (Signed)
Pt was seen two weeks ago. She has finished antibiotics and is now sick again. She would like to know if she can have another RX  Or does she need to come in. Please call [phone]

## 2015-05-10 ENCOUNTER — Other Ambulatory Visit: Payer: Self-pay | Admitting: *Deleted

## 2015-05-10 DIAGNOSIS — J0101 Acute recurrent maxillary sinusitis: Secondary | ICD-10-CM

## 2015-05-10 MED ORDER — LEVOCETIRIZINE DIHYDROCHLORIDE 5 MG PO TABS: 5.0000 mg | ORAL_TABLET | Freq: Every evening | ORAL | Status: DC

## 2015-06-24 ENCOUNTER — Encounter: Payer: Self-pay | Admitting: Family Medicine

## 2015-06-24 ENCOUNTER — Ambulatory Visit (INDEPENDENT_AMBULATORY_CARE_PROVIDER_SITE_OTHER): Payer: Commercial Managed Care - HMO | Admitting: Family Medicine

## 2015-06-24 ENCOUNTER — Ambulatory Visit (INDEPENDENT_AMBULATORY_CARE_PROVIDER_SITE_OTHER): Payer: Commercial Managed Care - HMO

## 2015-06-24 VITALS — BP 129/74 | HR 61 | Temp 98.0°F | Resp 20 | Wt 191.5 lb

## 2015-06-24 DIAGNOSIS — M25561 Pain in right knee: Secondary | ICD-10-CM | POA: Diagnosis not present

## 2015-06-24 DIAGNOSIS — S8992XA Unspecified injury of left lower leg, initial encounter: Secondary | ICD-10-CM | POA: Diagnosis not present

## 2015-06-24 DIAGNOSIS — M25562 Pain in left knee: Secondary | ICD-10-CM | POA: Diagnosis not present

## 2015-06-24 DIAGNOSIS — M7989 Other specified soft tissue disorders: Secondary | ICD-10-CM | POA: Diagnosis not present

## 2015-06-24 DIAGNOSIS — S8990XA Unspecified injury of unspecified lower leg, initial encounter: Secondary | ICD-10-CM | POA: Insufficient documentation

## 2015-06-24 MED ORDER — NAPROXEN 250 MG PO TABS: 500.0000 mg | ORAL_TABLET | Freq: Two times a day (BID) | ORAL | Status: DC | PRN
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
## 2015-06-24 NOTE — Patient Instructions (Signed)
Knee Pain Knee pain is a very common symptom and can have many causes. Knee pain often goes away when you follow your health care provider's instructions for relieving pain and discomfort at home. However, knee pain can develop into a condition that needs treatment. Some conditions may include:  Arthritis caused by wear and tear (osteoarthritis).  Arthritis caused by swelling and irritation (rheumatoid arthritis or gout).  A cyst or growth in your knee.  An infection in your knee joint.  An injury that will not heal.  Damage, swelling, or irritation of the tissues that support your knee (torn ligaments or tendinitis). If your knee pain continues, additional tests may be ordered to diagnose your condition. Tests may include X-rays or other imaging studies of your knee. You may also need to have fluid removed from your knee. Treatment for ongoing knee pain depends on the cause, but treatment may include:  Medicines to relieve pain or swelling.  Steroid injections in your knee.  Physical therapy.  Surgery. HOME CARE INSTRUCTIONS  Take medicines only as directed by your health care provider.  Rest your knee and keep it raised (elevated) while you are resting.  Do not do things that cause or worsen pain.  Avoid high-impact activities or exercises, such as running, jumping rope, or doing jumping jacks.  Apply ice to the knee area:  Put ice in a plastic bag.  Place a towel between your skin and the bag.  Leave the ice on for 20 minutes, 2-3 times a day.  Ask your health care provider if you should wear an elastic knee support.  Keep a pillow under your knee when you sleep.  Lose weight if you are overweight. Extra weight can put pressure on your knee.  Do not use any tobacco products, including cigarettes, chewing tobacco, or electronic cigarettes. If you need help quitting, ask your health care provider. Smoking may slow the healing of any bone and joint problems that you may  have. SEEK MEDICAL CARE IF:  Your knee pain continues, changes, or gets worse.  You have a fever along with knee pain.  Your knee buckles or locks up.  Your knee becomes more swollen. SEEK IMMEDIATE MEDICAL CARE IF:   Your knee joint feels hot to the touch.  You have chest pain or trouble breathing.   This information is not intended to replace advice given to you by your health care provider. Make sure you discuss any questions you have with your health care provider.   Document Released: 02/05/2007 Document Revised: 05/01/2014 Document Reviewed: 11/24/2013 Elsevier Interactive Patient Education 2016 [place] xray of knee and we will call you with results.

## 2015-06-25 ENCOUNTER — Telehealth: Payer: Self-pay | Admitting: Family Medicine

## 2015-06-25 NOTE — Telephone Encounter (Signed)
Spoke with patient reviewed results and instructions. 

## 2015-06-25 NOTE — Telephone Encounter (Signed)
Please call pt: - xray of knee is normal.  - continue naproxen 2 tabs, BID with food. Wrap in ace bandage. Elevate.  - will need to follow up in 2 weeks if no improvement. Sooner if worsening.

## 2015-08-11 ENCOUNTER — Other Ambulatory Visit: Payer: Self-pay | Admitting: Family Medicine

## 2015-08-17 ENCOUNTER — Telehealth: Payer: Self-pay | Admitting: Family Medicine

## 2015-08-17 NOTE — Telephone Encounter (Signed)
Patient would like to schedule her Medicare Wellness Exam. She is requesting her blood work the week prior to her appt. Please advise.

## 2015-08-17 NOTE — Telephone Encounter (Signed)
Due to all the differences in medicare codes and rules on screening, she has to have the labs ordered/drawn at the time of her MW visit in order to be covered fully by her insurance.

## 2015-09-01 ENCOUNTER — Ambulatory Visit (INDEPENDENT_AMBULATORY_CARE_PROVIDER_SITE_OTHER): Payer: Commercial Managed Care - HMO | Admitting: Family Medicine

## 2015-09-01 ENCOUNTER — Encounter: Payer: Self-pay | Admitting: Family Medicine

## 2015-09-01 VITALS — BP 146/80 | HR 62 | Temp 97.7°F | Resp 20 | Ht 66.0 in | Wt 192.8 lb

## 2015-09-01 DIAGNOSIS — Z130 Encounter for screening for diseases of the blood and blood-forming organs and certain disorders involving the immune mechanism: Secondary | ICD-10-CM | POA: Diagnosis not present

## 2015-09-01 DIAGNOSIS — E785 Hyperlipidemia, unspecified: Secondary | ICD-10-CM

## 2015-09-01 DIAGNOSIS — Z23 Encounter for immunization: Secondary | ICD-10-CM

## 2015-09-01 DIAGNOSIS — Z1159 Encounter for screening for other viral diseases: Secondary | ICD-10-CM

## 2015-09-01 DIAGNOSIS — Z0000 Encounter for general adult medical examination without abnormal findings: Secondary | ICD-10-CM | POA: Diagnosis not present

## 2015-09-01 DIAGNOSIS — I10 Essential (primary) hypertension: Secondary | ICD-10-CM

## 2015-09-01 DIAGNOSIS — E2839 Other primary ovarian failure: Secondary | ICD-10-CM | POA: Diagnosis not present

## 2015-09-01 DIAGNOSIS — Z13220 Encounter for screening for lipoid disorders: Secondary | ICD-10-CM

## 2015-09-01 DIAGNOSIS — E559 Vitamin D deficiency, unspecified: Secondary | ICD-10-CM

## 2015-09-01 LAB — COMPREHENSIVE METABOLIC PANEL
ALBUMIN: 4.5 g/dL (ref 3.5–5.2)
ALK PHOS: 82 U/L (ref 39–117)
ALT: 15 U/L (ref 0–35)
AST: 19 U/L (ref 0–37)
BILIRUBIN TOTAL: 0.5 mg/dL (ref 0.2–1.2)
BUN: 15 mg/dL (ref 6–23)
CO2: 28 mEq/L (ref 19–32)
Calcium: 9.6 mg/dL (ref 8.4–10.5)
Chloride: 106 mEq/L (ref 96–112)
Creatinine, Ser: 0.72 mg/dL (ref 0.40–1.20)
GFR: 88.45 mL/min (ref >60.00)
GLUCOSE: 99 mg/dL (ref 70–99)
Potassium: 5.5 mEq/L — ABNORMAL HIGH (ref 3.5–5.1)
Sodium: 142 mEq/L (ref 135–145)
TOTAL PROTEIN: 7.3 g/dL (ref 6.0–8.3)

## 2015-09-01 LAB — LIPID PANEL
CHOL/HDL RATIO: 3
Cholesterol: 212 mg/dL — ABNORMAL HIGH (ref 0–200)
HDL: 71.3 mg/dL (ref >39.00)
LDL Cholesterol: 126 mg/dL — ABNORMAL HIGH (ref 0–99)
NONHDL: 140.47
TRIGLYCERIDES: 73 mg/dL (ref 0.0–149.0)
VLDL: 14.6 mg/dL (ref 0.0–40.0)

## 2015-09-01 LAB — CBC WITH DIFFERENTIAL/PLATELET
BASOS ABS: 0 10*3/uL (ref 0.0–0.1)
Basophils Relative: 1 % (ref 0.0–3.0)
EOS PCT: 3.3 % (ref 0.0–5.0)
Eosinophils Absolute: 0.2 10*3/uL (ref 0.0–0.7)
HEMATOCRIT: 40.9 % (ref 36.0–46.0)
HEMOGLOBIN: 13.8 g/dL (ref 12.0–15.0)
LYMPHS ABS: 1.4 10*3/uL (ref 0.7–4.0)
LYMPHS PCT: 30.4 % (ref 12.0–46.0)
MCHC: 33.8 g/dL (ref 30.0–36.0)
MCV: 88.1 fl (ref 78.0–100.0)
MONOS PCT: 7.2 % (ref 3.0–12.0)
Monocytes Absolute: 0.3 10*3/uL (ref 0.1–1.0)
NEUTROS PCT: 58.1 % (ref 43.0–77.0)
Neutro Abs: 2.7 10*3/uL (ref 1.4–7.7)
Platelets: 323 10*3/uL (ref 150.0–400.0)
RBC: 4.65 Mil/uL (ref 3.87–5.11)
RDW: 13.9 % (ref 11.5–15.5)
WBC: 4.7 10*3/uL (ref 4.0–10.5)

## 2015-09-01 NOTE — Addendum Note (Signed)
Addended by: Ralph Dowdy on: 09/01/2015 09:27 AM   Modules accepted: Orders
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
## 2015-09-01 NOTE — Patient Instructions (Signed)
Your updated your Pneumonia vaccine today.  We collected the Hep C screen for you We will call you with lab results Schedule mammogram and bone density after 01/23/2016 Please complete living will/advanced directives and bring a copy to [place] to put in your chart.  Please make appt for your pelvic exam ( if desired) and BP/arthrits check.  Try the PREP program    Ms. Brittney Myers , Thank you for taking time to come for your Medicare Wellness Visit. I appreciate your ongoing commitment to your health goals. Please review the following plan we discussed and let me know if I can assist you in the future.   These are the goals we discussed: Goals    None      This is a list of the screening recommended for you and due dates:  Health Maintenance  Topic Date Due  .  Hepatitis C: One time screening is recommended by Center for Disease Control  (CDC) for  adults born from [birth year] through 1963.   05-02-1955  . Flu Shot  11/23/2015  . Mammogram  01/19/2016  . Colon Cancer Screening  11/11/2017  . Tetanus Vaccine  04/24/2020  . HIV Screening  Completed

## 2015-09-02 LAB — HEPATITIS C ANTIBODY: HCV AB: NEGATIVE

## 2015-09-02 LAB — VITAMIN D 25 HYDROXY (VIT D DEFICIENCY, FRACTURES): VITD: 27.39 ng/mL — ABNORMAL LOW (ref 30.00–100.00)

## 2015-09-03 ENCOUNTER — Telehealth: Payer: Self-pay | Admitting: Family Medicine

## 2015-09-03 NOTE — Telephone Encounter (Signed)
Please call pt: - her labs are normal with the following exceptions.  - Potassium mildly elevated, could be lab error, unless consuming extra potassium in her diet. If she is taking extra potassium please have her not use as frequently.  - cholesterol mildly elevated, but stable from last year. I would suggest she take fish oil supplement daily.  - Vit D much better, but just a liltle low (27). She can continue the 2000 u daily, and add and extra pill (3000u) on the weekends.

## 2015-09-06 NOTE — Telephone Encounter (Signed)
Left message for patient to return call to review labs and instructions. 

## 2015-09-08 ENCOUNTER — Ambulatory Visit (INDEPENDENT_AMBULATORY_CARE_PROVIDER_SITE_OTHER): Payer: Commercial Managed Care - HMO | Admitting: Family Medicine

## 2015-09-08 ENCOUNTER — Other Ambulatory Visit: Payer: Self-pay | Admitting: Family Medicine

## 2015-09-08 ENCOUNTER — Encounter: Payer: Self-pay | Admitting: Family Medicine

## 2015-09-08 ENCOUNTER — Other Ambulatory Visit (HOSPITAL_COMMUNITY)
Admission: RE | Admit: 2015-09-08 | Discharge: 2015-09-08 | Disposition: A | Discharge status: Home / Self Care | Payer: Commercial Managed Care - HMO | Source: Ambulatory Visit | Attending: Family Medicine | Admitting: Family Medicine

## 2015-09-08 VITALS — BP 151/84 | HR 71 | Temp 98.1°F | Resp 20 | Wt 192.0 lb

## 2015-09-08 DIAGNOSIS — Z6831 Body mass index (BMI) 31.0-31.9, adult: Secondary | ICD-10-CM

## 2015-09-08 DIAGNOSIS — I10 Essential (primary) hypertension: Secondary | ICD-10-CM

## 2015-09-08 DIAGNOSIS — M1990 Unspecified osteoarthritis, unspecified site: Secondary | ICD-10-CM

## 2015-09-08 DIAGNOSIS — Z639 Problem related to primary support group, unspecified: Secondary | ICD-10-CM | POA: Insufficient documentation

## 2015-09-08 DIAGNOSIS — Z01419 Encounter for gynecological examination (general) (routine) without abnormal findings: Secondary | ICD-10-CM | POA: Insufficient documentation

## 2015-09-08 DIAGNOSIS — E663 Overweight: Secondary | ICD-10-CM | POA: Insufficient documentation

## 2015-09-08 MED ORDER — HYDROCHLOROTHIAZIDE 25 MG PO TABS: 25.0000 mg | ORAL_TABLET | Freq: Every day | ORAL | Status: DC

## 2015-09-08 MED ORDER — DICLOFENAC SODIUM ER 100 MG PO TB24: 100.0000 mg | ORAL_TABLET | Freq: Every day | ORAL | Status: DC
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
## 2015-09-08 NOTE — Patient Instructions (Signed)
Start HCTZ 25 mg daily to help with BP. Take in the morning.  Start ASA 81 daily for cardiovascular protection if you can tolerate only.  Start Fish oil supplementation if desired, this is over the counter. This also helps with cholesterol.  Diclofenac is to be taken once daily, time does not matter for arthritis. Should be taken routinely for best benefits.  Consider starting OTC Prilosec for 14 -28 days to help with stomach upset.  Consider calling [facility] for counseling.   Follow up in 1 week for nurse visit for BP check.

## 2015-09-09 NOTE — Telephone Encounter (Signed)
Reviewed with patient at office visit on 09/08/15. Patient verbalized understanding.

## 2015-09-10 ENCOUNTER — Telehealth: Payer: Self-pay | Admitting: Family Medicine

## 2015-09-10 LAB — CYTOLOGY - PAP

## 2015-09-10 NOTE — Telephone Encounter (Signed)
Spoke with patient reviewed results. 

## 2015-09-10 NOTE — Telephone Encounter (Signed)
Please inform patient that her Pap collection was normal.

## 2015-11-25 ENCOUNTER — Other Ambulatory Visit: Payer: Self-pay | Admitting: Family Medicine

## 2015-11-25 DIAGNOSIS — M1990 Unspecified osteoarthritis, unspecified site: Secondary | ICD-10-CM

## 2016-01-28 ENCOUNTER — Ambulatory Visit
Admission: RE | Admit: 2016-01-28 | Discharge: 2016-01-28 | Disposition: A | Discharge status: Home / Self Care | Payer: Commercial Managed Care - HMO | Source: Ambulatory Visit | Attending: Family Medicine | Admitting: Family Medicine

## 2016-01-28 ENCOUNTER — Telehealth: Payer: Self-pay | Admitting: Family Medicine

## 2016-01-28 DIAGNOSIS — Z1159 Encounter for screening for other viral diseases: Secondary | ICD-10-CM

## 2016-01-28 DIAGNOSIS — Z130 Encounter for screening for diseases of the blood and blood-forming organs and certain disorders involving the immune mechanism: Secondary | ICD-10-CM

## 2016-01-28 DIAGNOSIS — E2839 Other primary ovarian failure: Secondary | ICD-10-CM

## 2016-01-28 DIAGNOSIS — Z780 Asymptomatic menopausal state: Secondary | ICD-10-CM | POA: Diagnosis not present

## 2016-01-28 DIAGNOSIS — Z13220 Encounter for screening for lipoid disorders: Secondary | ICD-10-CM

## 2016-01-28 DIAGNOSIS — E785 Hyperlipidemia, unspecified: Secondary | ICD-10-CM

## 2016-01-28 DIAGNOSIS — E559 Vitamin D deficiency, unspecified: Secondary | ICD-10-CM

## 2016-01-28 DIAGNOSIS — Z1231 Encounter for screening mammogram for malignant neoplasm of breast: Secondary | ICD-10-CM | POA: Diagnosis not present

## 2016-01-28 DIAGNOSIS — I10 Essential (primary) hypertension: Secondary | ICD-10-CM

## 2016-01-28 DIAGNOSIS — Z13820 Encounter for screening for osteoporosis: Secondary | ICD-10-CM | POA: Diagnosis not present

## 2016-01-28 NOTE — Telephone Encounter (Signed)
Please call pt: - her bone density study was normal. - She should continue vitd and calcium supplement.

## 2016-01-31 NOTE — Telephone Encounter (Signed)
Left message with patient results on patine voice mail per [facility]

## 2016-02-28 ENCOUNTER — Other Ambulatory Visit: Payer: Self-pay | Admitting: Family Medicine

## 2016-02-28 DIAGNOSIS — M1990 Unspecified osteoarthritis, unspecified site: Secondary | ICD-10-CM

## 2016-02-28 NOTE — Telephone Encounter (Signed)
Refilled diclofenac

## 2016-03-21 ENCOUNTER — Ambulatory Visit (INDEPENDENT_AMBULATORY_CARE_PROVIDER_SITE_OTHER): Payer: Commercial Managed Care - HMO

## 2016-03-21 DIAGNOSIS — Z23 Encounter for immunization: Secondary | ICD-10-CM

## 2016-05-22 ENCOUNTER — Other Ambulatory Visit: Payer: Self-pay | Admitting: Family Medicine

## 2016-05-22 DIAGNOSIS — M1990 Unspecified osteoarthritis, unspecified site: Secondary | ICD-10-CM

## 2016-07-10 ENCOUNTER — Telehealth: Payer: Self-pay | Admitting: Family Medicine

## 2016-07-10 NOTE — Telephone Encounter (Signed)
Pharmacy tech calling to notify that there are no more refills on flonase, diclofenac sodium, and hydrochlorothiazide.  Please send new prescriptions in to the [facility]. Medications are in medication list.  Thank you,  -LL

## 2016-07-11 ENCOUNTER — Encounter: Payer: Self-pay | Admitting: *Deleted

## 2016-07-11 ENCOUNTER — Encounter: Payer: Self-pay | Admitting: Family Medicine

## 2016-07-11 NOTE — Telephone Encounter (Signed)
Patient needs appt prior to anymore refills message sent in my chart.

## 2016-08-28 ENCOUNTER — Telehealth: Payer: Self-pay | Admitting: Family Medicine

## 2016-08-28 DIAGNOSIS — J0101 Acute recurrent maxillary sinusitis: Secondary | ICD-10-CM

## 2016-08-28 DIAGNOSIS — M1990 Unspecified osteoarthritis, unspecified site: Secondary | ICD-10-CM

## 2016-08-28 DIAGNOSIS — I10 Essential (primary) hypertension: Secondary | ICD-10-CM

## 2016-08-28 MED ORDER — HYDROCHLOROTHIAZIDE 25 MG PO TABS: 25.0000 mg | ORAL_TABLET | Freq: Every day | ORAL | 0 refills | Status: DC

## 2016-08-28 MED ORDER — FLUTICASONE PROPIONATE 50 MCG/ACT NA SUSP: 2.0000 | Freq: Every day | NASAL | 0 refills | Status: DC

## 2016-08-28 NOTE — Telephone Encounter (Signed)
30 day supply authorized for HCTZ and flonase patient will need to be seen before any additional refills. Diclofenac not refilled patient will have to be seen first.

## 2016-08-28 NOTE — Telephone Encounter (Signed)
Brittney Myers with [facility] calling to request 30 day refill of the following:  Diclofenac Sodium CR 100 MG 24 hr tablet  hydrochlorothiazide (HYDRODIURIL) 25 MG tablet  Pharmacy:  [facility], [place] - [address] [phone] (Phone) [phone] (Fax)

## 2016-08-28 NOTE — Telephone Encounter (Signed)
Per Mickel Baas, the pharmacy called back to report they also need a refill request for fluticasone (FLONASE) 50 MCG/ACT nasal spray

## 2016-09-04 DIAGNOSIS — H5210 Myopia, unspecified eye: Secondary | ICD-10-CM | POA: Diagnosis not present

## 2016-09-05 ENCOUNTER — Ambulatory Visit (INDEPENDENT_AMBULATORY_CARE_PROVIDER_SITE_OTHER): Payer: Medicare HMO | Admitting: Family Medicine

## 2016-09-05 ENCOUNTER — Ambulatory Visit: Payer: Self-pay

## 2016-09-05 ENCOUNTER — Encounter: Payer: Self-pay | Admitting: Family Medicine

## 2016-09-05 VITALS — BP 148/86 | HR 60 | Temp 96.0°F | Resp 18 | Ht 66.0 in | Wt 192.8 lb

## 2016-09-05 DIAGNOSIS — Z6831 Body mass index (BMI) 31.0-31.9, adult: Secondary | ICD-10-CM | POA: Diagnosis not present

## 2016-09-05 DIAGNOSIS — E559 Vitamin D deficiency, unspecified: Secondary | ICD-10-CM

## 2016-09-05 DIAGNOSIS — M4126 Other idiopathic scoliosis, lumbar region: Secondary | ICD-10-CM | POA: Diagnosis not present

## 2016-09-05 DIAGNOSIS — I10 Essential (primary) hypertension: Secondary | ICD-10-CM

## 2016-09-05 DIAGNOSIS — M349 Systemic sclerosis, unspecified: Secondary | ICD-10-CM

## 2016-09-05 DIAGNOSIS — Z0000 Encounter for general adult medical examination without abnormal findings: Secondary | ICD-10-CM

## 2016-09-05 DIAGNOSIS — M1990 Unspecified osteoarthritis, unspecified site: Secondary | ICD-10-CM | POA: Diagnosis not present

## 2016-09-05 DIAGNOSIS — Z79899 Other long term (current) drug therapy: Secondary | ICD-10-CM | POA: Diagnosis not present

## 2016-09-05 LAB — CBC WITH DIFFERENTIAL/PLATELET
BASOS ABS: 0.1 10*3/uL (ref 0.0–0.1)
BASOS PCT: 1.3 % (ref 0.0–3.0)
EOS PCT: 2.3 % (ref 0.0–5.0)
Eosinophils Absolute: 0.1 10*3/uL (ref 0.0–0.7)
HCT: 39.2 % (ref 36.0–46.0)
HEMOGLOBIN: 13.4 g/dL (ref 12.0–15.0)
LYMPHS ABS: 1.5 10*3/uL (ref 0.7–4.0)
Lymphocytes Relative: 28.1 % (ref 12.0–46.0)
MCHC: 34.3 g/dL (ref 30.0–36.0)
MCV: 90.1 fl (ref 78.0–100.0)
MONOS PCT: 8.6 % (ref 3.0–12.0)
Monocytes Absolute: 0.5 10*3/uL (ref 0.1–1.0)
NEUTROS ABS: 3.2 10*3/uL (ref 1.4–7.7)
Neutrophils Relative %: 59.7 % (ref 43.0–77.0)
PLATELETS: 354 10*3/uL (ref 150.0–400.0)
RBC: 4.35 Mil/uL (ref 3.87–5.11)
RDW: 13 % (ref 11.5–15.5)
WBC: 5.4 10*3/uL (ref 4.0–10.5)

## 2016-09-05 LAB — COMPLETE METABOLIC PANEL WITH GFR
ALT: 24 U/L (ref 6–29)
AST: 23 U/L (ref 10–35)
Albumin: 4.6 g/dL (ref 3.6–5.1)
Alkaline Phosphatase: 108 U/L (ref 33–130)
BILIRUBIN TOTAL: 0.4 mg/dL (ref 0.2–1.2)
BUN: 16 mg/dL (ref 7–25)
CALCIUM: 9.8 mg/dL (ref 8.6–10.4)
CHLORIDE: 100 mmol/L (ref 98–110)
CO2: 24 mmol/L (ref 20–31)
CREATININE: 0.82 mg/dL (ref 0.50–1.05)
GFR, Est African American: >89 mL/min (ref >=60)
GFR, Est Non African American: 79 mL/min (ref >=60)
Glucose, Bld: 90 mg/dL (ref 65–99)
Potassium: 4.3 mmol/L (ref 3.5–5.3)
Sodium: 138 mmol/L (ref 135–146)
TOTAL PROTEIN: 7.9 g/dL (ref 6.1–8.1)

## 2016-09-05 LAB — LIPID PANEL
CHOL/HDL RATIO: 3
Cholesterol: 232 mg/dL — ABNORMAL HIGH (ref 0–200)
HDL: 67.7 mg/dL (ref >39.00)
LDL CALC: 142 mg/dL — AB (ref 0–99)
NONHDL: 164.44
TRIGLYCERIDES: 112 mg/dL (ref 0.0–149.0)
VLDL: 22.4 mg/dL (ref 0.0–40.0)

## 2016-09-05 LAB — TSH: TSH: 1.32 u[IU]/mL (ref 0.35–4.50)

## 2016-09-05 LAB — VITAMIN D 25 HYDROXY (VIT D DEFICIENCY, FRACTURES): VITD: 29.59 ng/mL — ABNORMAL LOW (ref 30.00–100.00)

## 2016-09-05 MED ORDER — HYDROCHLOROTHIAZIDE 25 MG PO TABS: 25.0000 mg | ORAL_TABLET | Freq: Every day | ORAL | 1 refills | Status: DC

## 2016-09-05 MED ORDER — BD ASSURE BPM/AUTO ARM CUFF MISC: 0 refills | Status: DC

## 2016-09-05 MED ORDER — DICLOFENAC SODIUM ER 100 MG PO TB24: 100.0000 mg | ORAL_TABLET | Freq: Every day | ORAL | 3 refills | Status: DC
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
## 2016-09-05 NOTE — Patient Instructions (Addendum)
Bring a copy of your advance directives to your next office visit.  Continue doing brain stimulating activities (puzzles, reading, adult coloring books, staying active) to keep memory sharp.   Continue to eat heart healthy diet (full of fruits, vegetables, whole grains, lean protein, water--limit salt, fat, and sugar intake) and increase physical activity as tolerated.   Health Maintenance, Female Adopting a healthy lifestyle and getting preventive care can go a long way to promote health and wellness. Talk with your health care provider about what schedule of regular examinations is right for you. This is a good chance for you to check in with your provider about disease prevention and staying healthy. In between checkups, there are plenty of things you can do on your own. Experts have done a lot of research about which lifestyle changes and preventive measures are most likely to keep you healthy. Ask your health care provider for more information. Weight and diet Eat a healthy diet  Be sure to include plenty of vegetables, fruits, low-fat dairy products, and lean protein.  Do not eat a lot of foods high in solid fats, added sugars, or salt.  Get regular exercise. This is one of the most important things you can do for your health.  Most adults should exercise for at least 150 minutes each week. The exercise should increase your heart rate and make you sweat (moderate-intensity exercise).  Most adults should also do strengthening exercises at least twice a week. This is in addition to the moderate-intensity exercise. Maintain a healthy weight  Body mass index (BMI) is a measurement that can be used to identify possible weight problems. It estimates body fat based on height and weight. Your health care provider can help determine your BMI and help you achieve or maintain a healthy weight.  For females 4 years of age and older:  A BMI below 18.5 is considered underweight.  A BMI of 18.5  to 24.9 is normal.  A BMI of 25 to 29.9 is considered overweight.  A BMI of 30 and above is considered obese. Watch levels of cholesterol and blood lipids  You should start having your blood tested for lipids and cholesterol at 59 years of age, then have this test every 5 years.  You may need to have your cholesterol levels checked more often if:  Your lipid or cholesterol levels are high.  You are older than 59 years of age.  You are at high risk for heart disease. Cancer screening Lung Cancer  Lung cancer screening is recommended for adults 68-59 years old who are at high risk for lung cancer because of a history of smoking.  A yearly low-dose CT scan of the lungs is recommended for people who:  Currently smoke.  Have quit within the past 15 years.  Have at least a 30-pack-year history of smoking. A pack year is smoking an average of one pack of cigarettes a day for 1 year.  Yearly screening should continue until it has been 15 years since you quit.  Yearly screening should stop if you develop a health problem that would prevent you from having lung cancer treatment. Breast Cancer  Practice breast self-awareness. This means understanding how your breasts normally appear and feel.  It also means doing regular breast self-exams. Let your health care provider know about any changes, no matter how small.  If you are in your 20s or 30s, you should have a clinical breast exam (CBE) by a health care provider every 1-3  years as part of a regular health exam.  If you are 44 or older, have a CBE every year. Also consider having a breast X-ray (mammogram) every year.  If you have a family history of breast cancer, talk to your health care provider about genetic screening.  If you are at high risk for breast cancer, talk to your health care provider about having an MRI and a mammogram every year.  Breast cancer gene (BRCA) assessment is recommended for women who have family  members with BRCA-related cancers. BRCA-related cancers include:  Breast.  Ovarian.  Tubal.  Peritoneal cancers.  Results of the assessment will determine the need for genetic counseling and BRCA1 and BRCA2 testing. Cervical Cancer  Your health care provider may recommend that you be screened regularly for cancer of the pelvic organs (ovaries, uterus, and vagina). This screening involves a pelvic examination, including checking for microscopic changes to the surface of your cervix (Pap test). You may be encouraged to have this screening done every 3 years, beginning at age 78.  For women ages 25-65, health care providers may recommend pelvic exams and Pap testing every 3 years, or they may recommend the Pap and pelvic exam, combined with testing for human papilloma virus (HPV), every 5 years. Some types of HPV increase your risk of cervical cancer. Testing for HPV may also be done on women of any age with unclear Pap test results.  Other health care providers may not recommend any screening for nonpregnant women who are considered low risk for pelvic cancer and who do not have symptoms. Ask your health care provider if a screening pelvic exam is right for you.  If you have had past treatment for cervical cancer or a condition that could lead to cancer, you need Pap tests and screening for cancer for at least 20 years after your treatment. If Pap tests have been discontinued, your risk factors (such as having a new sexual partner) need to be reassessed to determine if screening should resume. Some women have medical problems that increase the chance of getting cervical cancer. In these cases, your health care provider may recommend more frequent screening and Pap tests. Colorectal Cancer  This type of cancer can be detected and often prevented.  Routine colorectal cancer screening usually begins at 59 years of age and continues through 59 years of age.  Your health care provider may recommend  screening at an earlier age if you have risk factors for colon cancer.  Your health care provider may also recommend using home test kits to check for hidden blood in the stool.  A small camera at the end of a tube can be used to examine your colon directly (sigmoidoscopy or colonoscopy). This is done to check for the earliest forms of colorectal cancer.  Routine screening usually begins at age 44.  Direct examination of the colon should be repeated every 5-10 years through 59 years of age. However, you may need to be screened more often if early forms of precancerous polyps or small growths are found. Skin Cancer  Check your skin from head to toe regularly.  Tell your health care provider about any new moles or changes in moles, especially if there is a change in a mole's shape or color.  Also tell your health care provider if you have a mole that is larger than the size of a pencil eraser.  Always use sunscreen. Apply sunscreen liberally and repeatedly throughout the day.  Protect yourself by wearing  long sleeves, pants, a wide-brimmed hat, and sunglasses whenever you are outside. Heart disease, diabetes, and high blood pressure  High blood pressure causes heart disease and increases the risk of stroke. High blood pressure is more likely to develop in:  People who have blood pressure in the high end of the normal range (130-139/85-89 mm Hg).  People who are overweight or obese.  People who are African American.  If you are 33-42 years of age, have your blood pressure checked every 3-5 years. If you are 108 years of age or older, have your blood pressure checked every year. You should have your blood pressure measured twice-once when you are at a hospital or clinic, and once when you are not at a hospital or clinic. Record the average of the two measurements. To check your blood pressure when you are not at a hospital or clinic, you can use:  An automated blood pressure machine at a  pharmacy.  A home blood pressure monitor.  If you are between 108 years and 27 years old, ask your health care provider if you should take aspirin to prevent strokes.  Have regular diabetes screenings. This involves taking a blood sample to check your fasting blood sugar level.  If you are at a normal weight and have a low risk for diabetes, have this test once every three years after 59 years of age.  If you are overweight and have a high risk for diabetes, consider being tested at a younger age or more often. Preventing infection Hepatitis B  If you have a higher risk for hepatitis B, you should be screened for this virus. You are considered at high risk for hepatitis B if:  You were born in a country where hepatitis B is common. Ask your health care provider which countries are considered high risk.  Your parents were born in a high-risk country, and you have not been immunized against hepatitis B (hepatitis B vaccine).  You have HIV or AIDS.  You use needles to inject street drugs.  You live with someone who has hepatitis B.  You have had sex with someone who has hepatitis B.  You get hemodialysis treatment.  You take certain medicines for conditions, including cancer, organ transplantation, and autoimmune conditions. Hepatitis C  Blood testing is recommended for:  Everyone born from [birth year] through 1963.  Anyone with known risk factors for hepatitis C. Sexually transmitted infections (STIs)  You should be screened for sexually transmitted infections (STIs) including gonorrhea and chlamydia if:  You are sexually active and are younger than 59 years of age.  You are older than 59 years of age and your health care provider tells you that you are at risk for this type of infection.  Your sexual activity has changed since you were last screened and you are at an increased risk for chlamydia or gonorrhea. Ask your health care provider if you are at risk.  If you do not have  HIV, but are at risk, it may be recommended that you take a prescription medicine daily to prevent HIV infection. This is called pre-exposure prophylaxis (PrEP). You are considered at risk if:  You are sexually active and do not regularly use condoms or know the HIV status of your partner(s).  You take drugs by injection.  You are sexually active with a partner who has HIV. Talk with your health care provider about whether you are at high risk of being infected with HIV. If you choose to  begin PrEP, you should first be tested for HIV. You should then be tested every 3 months for as long as you are taking PrEP. Pregnancy  If you are premenopausal and you may become pregnant, ask your health care provider about preconception counseling.  If you may become pregnant, take 400 to 800 micrograms (mcg) of folic acid every day.  If you want to prevent pregnancy, talk to your health care provider about birth control (contraception). Osteoporosis and menopause  Osteoporosis is a disease in which the bones lose minerals and strength with aging. This can result in serious bone fractures. Your risk for osteoporosis can be identified using a bone density scan.  If you are 29 years of age or older, or if you are at risk for osteoporosis and fractures, ask your health care provider if you should be screened.  Ask your health care provider whether you should take a calcium or vitamin D supplement to lower your risk for osteoporosis.  Menopause may have certain physical symptoms and risks.  Hormone replacement therapy may reduce some of these symptoms and risks. Talk to your health care provider about whether hormone replacement therapy is right for you. Follow these instructions at home:  Schedule regular health, dental, and eye exams.  Stay current with your immunizations.  Do not use any tobacco products including cigarettes, chewing tobacco, or electronic cigarettes.  If you are pregnant, do not  drink alcohol.  If you are breastfeeding, limit how much and how often you drink alcohol.  Limit alcohol intake to no more than 1 drink per day for nonpregnant women. One drink equals 12 ounces of beer, 5 ounces of wine, or 1 ounces of hard liquor.  Do not use street drugs.  Do not share needles.  Ask your health care provider for help if you need support or information about quitting drugs.  Tell your health care provider if you often feel depressed.  Tell your health care provider if you have ever been abused or do not feel safe at home. This information is not intended to replace advice given to you by your health care provider. Make sure you discuss any questions you have with your health care provider. Document Released: 10/24/2010 Document Revised: 09/16/2015 Document Reviewed: 01/12/2015 Elsevier Interactive Patient Education  2017 [employer].

## 2016-09-06 ENCOUNTER — Telehealth: Payer: Self-pay | Admitting: Family Medicine

## 2016-09-06 LAB — HEMOGLOBIN A1C
Hgb A1c MFr Bld: 5.4 % (ref <5.7)
MEAN PLASMA GLUCOSE: 108 mg/dL

## 2016-09-06 NOTE — Telephone Encounter (Signed)
Spoke with patient reviewed information and instructions patient verbalized understanding. 

## 2016-09-06 NOTE — Telephone Encounter (Addendum)
Please call pt: - her labs are all stable, with the exception of her cholesterol which is a little higher. I do not believe she needs a prescribed medication yet. I would recommend she take fish oil supplement 1000 mg if she is able to tolerate it.  - also her vit d is about normal, but would recommend she take 3000 u daily if she is taking the 2000 u she reported (with food) - f/u 6 mos on BP

## 2016-09-23 ENCOUNTER — Other Ambulatory Visit: Payer: Self-pay | Admitting: Family Medicine

## 2016-09-23 DIAGNOSIS — J0101 Acute recurrent maxillary sinusitis: Secondary | ICD-10-CM

## 2016-12-26 DIAGNOSIS — M4316 Spondylolisthesis, lumbar region: Secondary | ICD-10-CM | POA: Diagnosis not present

## 2016-12-26 DIAGNOSIS — M419 Scoliosis, unspecified: Secondary | ICD-10-CM | POA: Diagnosis not present

## 2016-12-26 DIAGNOSIS — M40209 Unspecified kyphosis, site unspecified: Secondary | ICD-10-CM | POA: Diagnosis not present

## 2017-01-30 ENCOUNTER — Encounter: Payer: Self-pay | Admitting: Family Medicine

## 2017-01-30 ENCOUNTER — Ambulatory Visit (INDEPENDENT_AMBULATORY_CARE_PROVIDER_SITE_OTHER): Payer: Medicare HMO | Admitting: Family Medicine

## 2017-01-30 VITALS — BP 132/85 | HR 62 | Temp 98.2°F | Resp 20 | Ht 66.0 in | Wt 193.0 lb

## 2017-01-30 DIAGNOSIS — M4126 Other idiopathic scoliosis, lumbar region: Secondary | ICD-10-CM | POA: Diagnosis not present

## 2017-01-30 DIAGNOSIS — Z23 Encounter for immunization: Secondary | ICD-10-CM

## 2017-01-30 DIAGNOSIS — I10 Essential (primary) hypertension: Secondary | ICD-10-CM | POA: Diagnosis not present

## 2017-01-30 DIAGNOSIS — Z6831 Body mass index (BMI) 31.0-31.9, adult: Secondary | ICD-10-CM | POA: Diagnosis not present

## 2017-01-30 DIAGNOSIS — M1990 Unspecified osteoarthritis, unspecified site: Secondary | ICD-10-CM

## 2017-01-30 MED ORDER — HYDROCHLOROTHIAZIDE 25 MG PO TABS: 25.0000 mg | ORAL_TABLET | Freq: Every day | ORAL | 1 refills | Status: DC

## 2017-01-30 NOTE — Patient Instructions (Signed)
BP looks good. Refills on medication.  Try to get at least 1200 calories a day, your body needs full.  Eat small frequent meals, like every 3-4 hours.   Exercise greater 150 minutes a week.    Try the paleo diet and avoid pasta, rice, potato, breads, sugar.
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
## 2017-03-08 ENCOUNTER — Ambulatory Visit: Payer: Medicare HMO | Admitting: Family Medicine

## 2017-03-17 ENCOUNTER — Other Ambulatory Visit: Payer: Self-pay | Admitting: Family Medicine

## 2017-05-18 ENCOUNTER — Ambulatory Visit (INDEPENDENT_AMBULATORY_CARE_PROVIDER_SITE_OTHER): Payer: Medicare HMO | Admitting: Family Medicine

## 2017-05-18 ENCOUNTER — Encounter: Payer: Self-pay | Admitting: Family Medicine

## 2017-05-18 VITALS — BP 118/73 | HR 98 | Temp 97.6°F | Wt 192.0 lb

## 2017-05-18 DIAGNOSIS — M25561 Pain in right knee: Secondary | ICD-10-CM

## 2017-05-18 DIAGNOSIS — M1711 Unilateral primary osteoarthritis, right knee: Secondary | ICD-10-CM | POA: Diagnosis not present

## 2017-05-18 NOTE — Patient Instructions (Signed)
[facility] 11:00, Dr. Maryruth Eve.  They are waiting on you.
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
## 2017-05-30 DIAGNOSIS — R262 Difficulty in walking, not elsewhere classified: Secondary | ICD-10-CM | POA: Diagnosis not present

## 2017-05-30 DIAGNOSIS — M25561 Pain in right knee: Secondary | ICD-10-CM | POA: Diagnosis not present

## 2017-06-01 DIAGNOSIS — R262 Difficulty in walking, not elsewhere classified: Secondary | ICD-10-CM | POA: Diagnosis not present

## 2017-06-01 DIAGNOSIS — M25561 Pain in right knee: Secondary | ICD-10-CM | POA: Diagnosis not present

## 2017-06-05 DIAGNOSIS — R262 Difficulty in walking, not elsewhere classified: Secondary | ICD-10-CM | POA: Diagnosis not present

## 2017-06-05 DIAGNOSIS — M25561 Pain in right knee: Secondary | ICD-10-CM | POA: Diagnosis not present

## 2017-06-12 DIAGNOSIS — R262 Difficulty in walking, not elsewhere classified: Secondary | ICD-10-CM | POA: Diagnosis not present

## 2017-06-12 DIAGNOSIS — M25561 Pain in right knee: Secondary | ICD-10-CM | POA: Diagnosis not present

## 2017-07-02 ENCOUNTER — Telehealth: Payer: Self-pay

## 2017-07-02 NOTE — Telephone Encounter (Signed)
Ok to order 

## 2017-07-02 NOTE — Telephone Encounter (Signed)
Patient requesting order for mammogram, last 01/28/2016. I will place order if okay with you. Scheduled for AWV and CPE in May 2019.

## 2017-07-03 ENCOUNTER — Other Ambulatory Visit: Payer: Self-pay

## 2017-07-03 DIAGNOSIS — Z1239 Encounter for other screening for malignant neoplasm of breast: Secondary | ICD-10-CM

## 2017-07-03 NOTE — Telephone Encounter (Signed)
Order placed at [facility].

## 2017-07-26 ENCOUNTER — Ambulatory Visit: Payer: Self-pay | Admitting: *Deleted

## 2017-07-26 NOTE — Telephone Encounter (Signed)
Pt reports fell down stairs this am at 0700; "2-3 steps."  Fell on left buttocks area and coccyx. Reports 3-4/10 pain; no swelling or bruising presently. ROM is WNL.  Pt has been applying ice. Pain increases with sitting. No other injuries.  Home care advise given per protocol however pt is questioning if Dr. Raoul Pitch wants her to have an X-Ray due to her H/O DDD.    Please advise: [phone]  Reason for Disposition . Minor tailbone injury  Answer Assessment - Initial Assessment Questions 1. MECHANISM: "How did the injury happen?"       Fell down 2-3 stairs 2. ONSET: "When did the injury happen?" (Minutes or hours ago)      0700 this am 3. LOCATION: "Where is the injury located?"      Buttocks lt side and tailbone 4. SEVERITY: "Can you sit?" "Can you walk?"       yes 5. PAIN: "Is there pain?" If so, ask: "How bad is the pain?"    (Scale 1-10; or mild, moderate, severe)     Mild-moderate when sitting 6. SIZE: For bruises, or swelling, ask: "How large is it?" (e.g., inches or centimeters)      no 7. OTHER SYMPTOMS: "Do you have any other symptoms?" (e.g., numbness, back pain)     No  Protocols used: TAILBONE INJURY-A-AH

## 2017-07-26 NOTE — Telephone Encounter (Signed)
Spoke with patient Dr Raoul Pitch is out of the office today. Advised patient if having increased pain or difficulty ambulating she needs to go to UC for evaluation patient verbalized understanding.

## 2017-08-08 ENCOUNTER — Ambulatory Visit
Admission: RE | Admit: 2017-08-08 | Discharge: 2017-08-08 | Disposition: A | Discharge status: Home / Self Care | Payer: Medicare HMO | Source: Ambulatory Visit | Attending: Family Medicine | Admitting: Family Medicine

## 2017-08-08 DIAGNOSIS — Z1239 Encounter for other screening for malignant neoplasm of breast: Secondary | ICD-10-CM

## 2017-08-08 DIAGNOSIS — Z1231 Encounter for screening mammogram for malignant neoplasm of breast: Secondary | ICD-10-CM | POA: Diagnosis not present
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
## 2017-09-11 ENCOUNTER — Encounter: Payer: Self-pay | Admitting: Family Medicine

## 2017-09-11 ENCOUNTER — Ambulatory Visit (INDEPENDENT_AMBULATORY_CARE_PROVIDER_SITE_OTHER): Payer: Medicare HMO

## 2017-09-11 ENCOUNTER — Ambulatory Visit (INDEPENDENT_AMBULATORY_CARE_PROVIDER_SITE_OTHER): Payer: Medicare HMO | Admitting: Family Medicine

## 2017-09-11 ENCOUNTER — Other Ambulatory Visit: Payer: Self-pay

## 2017-09-11 VITALS — BP 132/78 | HR 70 | Temp 97.7°F | Resp 16 | Ht 66.25 in | Wt 189.0 lb

## 2017-09-11 DIAGNOSIS — I10 Essential (primary) hypertension: Secondary | ICD-10-CM

## 2017-09-11 DIAGNOSIS — E559 Vitamin D deficiency, unspecified: Secondary | ICD-10-CM | POA: Diagnosis not present

## 2017-09-11 DIAGNOSIS — Z23 Encounter for immunization: Secondary | ICD-10-CM

## 2017-09-11 DIAGNOSIS — Z0000 Encounter for general adult medical examination without abnormal findings: Secondary | ICD-10-CM | POA: Diagnosis not present

## 2017-09-11 DIAGNOSIS — Z1211 Encounter for screening for malignant neoplasm of colon: Secondary | ICD-10-CM | POA: Diagnosis not present

## 2017-09-11 DIAGNOSIS — M1990 Unspecified osteoarthritis, unspecified site: Secondary | ICD-10-CM | POA: Diagnosis not present

## 2017-09-11 DIAGNOSIS — Z131 Encounter for screening for diabetes mellitus: Secondary | ICD-10-CM

## 2017-09-11 DIAGNOSIS — E7800 Pure hypercholesterolemia, unspecified: Secondary | ICD-10-CM | POA: Diagnosis not present

## 2017-09-11 LAB — CBC WITH DIFFERENTIAL/PLATELET
BASOS PCT: 0.2 % (ref 0.0–3.0)
Basophils Absolute: 0 10*3/uL (ref 0.0–0.1)
EOS PCT: 2.9 % (ref 0.0–5.0)
Eosinophils Absolute: 0.1 10*3/uL (ref 0.0–0.7)
HEMATOCRIT: 38.7 % (ref 36.0–46.0)
HEMOGLOBIN: 13.2 g/dL (ref 12.0–15.0)
LYMPHS PCT: 32.4 % (ref 12.0–46.0)
Lymphs Abs: 1.6 10*3/uL (ref 0.7–4.0)
MCHC: 34 g/dL (ref 30.0–36.0)
MCV: 90.8 fl (ref 78.0–100.0)
MONO ABS: 0.5 10*3/uL (ref 0.1–1.0)
MONOS PCT: 9.5 % (ref 3.0–12.0)
Neutro Abs: 2.7 10*3/uL (ref 1.4–7.7)
Neutrophils Relative %: 55 % (ref 43.0–77.0)
Platelets: 332 10*3/uL (ref 150.0–400.0)
RBC: 4.26 Mil/uL (ref 3.87–5.11)
RDW: 12.5 % (ref 11.5–15.5)
WBC: 4.9 10*3/uL (ref 4.0–10.5)

## 2017-09-11 LAB — VITAMIN D 25 HYDROXY (VIT D DEFICIENCY, FRACTURES): VITD: 35.94 ng/mL (ref 30.00–100.00)

## 2017-09-11 LAB — COMPREHENSIVE METABOLIC PANEL
ALK PHOS: 99 U/L (ref 39–117)
ALT: 21 U/L (ref 0–35)
AST: 21 U/L (ref 0–37)
Albumin: 4.6 g/dL (ref 3.5–5.2)
BUN: 18 mg/dL (ref 6–23)
CALCIUM: 10 mg/dL (ref 8.4–10.5)
CO2: 31 mEq/L (ref 19–32)
CREATININE: 0.77 mg/dL (ref 0.40–1.20)
Chloride: 100 mEq/L (ref 96–112)
GFR: 81.29 mL/min (ref >60.00)
Glucose, Bld: 93 mg/dL (ref 70–99)
POTASSIUM: 4.7 meq/L (ref 3.5–5.1)
SODIUM: 140 meq/L (ref 135–145)
TOTAL PROTEIN: 7.8 g/dL (ref 6.0–8.3)
Total Bilirubin: 0.4 mg/dL (ref 0.2–1.2)

## 2017-09-11 LAB — TSH: TSH: 1.11 u[IU]/mL (ref 0.35–4.50)

## 2017-09-11 LAB — LIPID PANEL
CHOL/HDL RATIO: 4
Cholesterol: 225 mg/dL — ABNORMAL HIGH (ref 0–200)
HDL: 63.7 mg/dL (ref >39.00)
LDL Cholesterol: 137 mg/dL — ABNORMAL HIGH (ref 0–99)
NONHDL: 161.62
Triglycerides: 121 mg/dL (ref 0.0–149.0)
VLDL: 24.2 mg/dL (ref 0.0–40.0)

## 2017-09-11 LAB — HEMOGLOBIN A1C: Hgb A1c MFr Bld: 5.6 % (ref 4.6–6.5)

## 2017-09-11 MED ORDER — HYDROCHLOROTHIAZIDE 25 MG PO TABS: 25.0000 mg | ORAL_TABLET | Freq: Every day | ORAL | 1 refills | Status: DC

## 2017-09-11 MED ORDER — DICLOFENAC SODIUM ER 100 MG PO TB24: 100.0000 mg | ORAL_TABLET | Freq: Every day | ORAL | 3 refills | Status: DC

## 2017-09-11 MED ORDER — ZOSTER VAC RECOMB ADJUVANTED 50 MCG/0.5ML IM SUSR: 0.5000 mL | Freq: Once | INTRAMUSCULAR | 1 refills | Status: DC

## 2017-09-11 MED ORDER — ZOSTER VAC RECOMB ADJUVANTED 50 MCG/0.5ML IM SUSR: 0.5000 mL | Freq: Once | INTRAMUSCULAR | 1 refills | Status: AC

## 2017-09-11 NOTE — Patient Instructions (Addendum)
Shingles vaccine at pharmacy.   Schedule appointment for colonoscopy consult.   Bring a copy of your living will and/or healthcare power of attorney to your next office visit.  Continue doing brain stimulating activities (puzzles, reading, adult coloring books, staying active) to keep memory sharp.   Health Maintenance, Female Adopting a healthy lifestyle and getting preventive care can go a long way to promote health and wellness. Talk with your health care provider about what schedule of regular examinations is right for you. This is a good chance for you to check in with your provider about disease prevention and staying healthy. In between checkups, there are plenty of things you can do on your own. Experts have done a lot of research about which lifestyle changes and preventive measures are most likely to keep you healthy. Ask your health care provider for more information. Weight and diet Eat a healthy diet  Be sure to include plenty of vegetables, fruits, low-fat dairy products, and lean protein.  Do not eat a lot of foods high in solid fats, added sugars, or salt.  Get regular exercise. This is one of the most important things you can do for your health. ? Most adults should exercise for at least 150 minutes each week. The exercise should increase your heart rate and make you sweat (moderate-intensity exercise). ? Most adults should also do strengthening exercises at least twice a week. This is in addition to the moderate-intensity exercise.  Maintain a healthy weight  Body mass index (BMI) is a measurement that can be used to identify possible weight problems. It estimates body fat based on height and weight. Your health care provider can help determine your BMI and help you achieve or maintain a healthy weight.  For females 27 years of age and older: ? A BMI below 18.5 is considered underweight. ? A BMI of 18.5 to 24.9 is normal. ? A BMI of 25 to 29.9 is considered  overweight. ? A BMI of 30 and above is considered obese.  Watch levels of cholesterol and blood lipids  You should start having your blood tested for lipids and cholesterol at 60 years of age, then have this test every 5 years.  You may need to have your cholesterol levels checked more often if: ? Your lipid or cholesterol levels are high. ? You are older than 60 years of age. ? You are at high risk for heart disease.  Cancer screening Lung Cancer  Lung cancer screening is recommended for adults 23-17 years old who are at high risk for lung cancer because of a history of smoking.  A yearly low-dose CT scan of the lungs is recommended for people who: ? Currently smoke. ? Have quit within the past 15 years. ? Have at least a 30-pack-year history of smoking. A pack year is smoking an average of one pack of cigarettes a day for 1 year.  Yearly screening should continue until it has been 15 years since you quit.  Yearly screening should stop if you develop a health problem that would prevent you from having lung cancer treatment.  Breast Cancer  Practice breast self-awareness. This means understanding how your breasts normally appear and feel.  It also means doing regular breast self-exams. Let your health care provider know about any changes, no matter how small.  If you are in your 20s or 30s, you should have a clinical breast exam (CBE) by a health care provider every 1-3 years as part of a regular  health exam.  If you are 40 or older, have a CBE every year. Also consider having a breast X-ray (mammogram) every year.  If you have a family history of breast cancer, talk to your health care provider about genetic screening.  If you are at high risk for breast cancer, talk to your health care provider about having an MRI and a mammogram every year.  Breast cancer gene (BRCA) assessment is recommended for women who have family members with BRCA-related cancers. BRCA-related cancers  include: ? Breast. ? Ovarian. ? Tubal. ? Peritoneal cancers.  Results of the assessment will determine the need for genetic counseling and BRCA1 and BRCA2 testing.  Cervical Cancer Your health care provider may recommend that you be screened regularly for cancer of the pelvic organs (ovaries, uterus, and vagina). This screening involves a pelvic examination, including checking for microscopic changes to the surface of your cervix (Pap test). You may be encouraged to have this screening done every 3 years, beginning at age 21.  For women ages 30-65, health care providers may recommend pelvic exams and Pap testing every 3 years, or they may recommend the Pap and pelvic exam, combined with testing for human papilloma virus (HPV), every 5 years. Some types of HPV increase your risk of cervical cancer. Testing for HPV may also be done on women of any age with unclear Pap test results.  Other health care providers may not recommend any screening for nonpregnant women who are considered low risk for pelvic cancer and who do not have symptoms. Ask your health care provider if a screening pelvic exam is right for you.  If you have had past treatment for cervical cancer or a condition that could lead to cancer, you need Pap tests and screening for cancer for at least 20 years after your treatment. If Pap tests have been discontinued, your risk factors (such as having a new sexual partner) need to be reassessed to determine if screening should resume. Some women have medical problems that increase the chance of getting cervical cancer. In these cases, your health care provider may recommend more frequent screening and Pap tests.  Colorectal Cancer  This type of cancer can be detected and often prevented.  Routine colorectal cancer screening usually begins at 60 years of age and continues through 60 years of age.  Your health care provider may recommend screening at an earlier age if you have risk factors  for colon cancer.  Your health care provider may also recommend using home test kits to check for hidden blood in the stool.  A small camera at the end of a tube can be used to examine your colon directly (sigmoidoscopy or colonoscopy). This is done to check for the earliest forms of colorectal cancer.  Routine screening usually begins at age 50.  Direct examination of the colon should be repeated every 5-10 years through 60 years of age. However, you may need to be screened more often if early forms of precancerous polyps or small growths are found.  Skin Cancer  Check your skin from head to toe regularly.  Tell your health care provider about any new moles or changes in moles, especially if there is a change in a mole's shape or color.  Also tell your health care provider if you have a mole that is larger than the size of a pencil eraser.  Always use sunscreen. Apply sunscreen liberally and repeatedly throughout the day.  Protect yourself by wearing long sleeves, pants, a   wide-brimmed hat, and sunglasses whenever you are outside.  Heart disease, diabetes, and high blood pressure  High blood pressure causes heart disease and increases the risk of stroke. High blood pressure is more likely to develop in: ? People who have blood pressure in the high end of the normal range (130-139/85-89 mm Hg). ? People who are overweight or obese. ? People who are African American.  If you are 54-30 years of age, have your blood pressure checked every 3-5 years. If you are 3 years of age or older, have your blood pressure checked every year. You should have your blood pressure measured twice-once when you are at a hospital or clinic, and once when you are not at a hospital or clinic. Record the average of the two measurements. To check your blood pressure when you are not at a hospital or clinic, you can use: ? An automated blood pressure machine at a pharmacy. ? A home blood pressure monitor.  If  you are between 55 years and 21 years old, ask your health care provider if you should take aspirin to prevent strokes.  Have regular diabetes screenings. This involves taking a blood sample to check your fasting blood sugar level. ? If you are at a normal weight and have a low risk for diabetes, have this test once every three years after 60 years of age. ? If you are overweight and have a high risk for diabetes, consider being tested at a younger age or more often. Preventing infection Hepatitis B  If you have a higher risk for hepatitis B, you should be screened for this virus. You are considered at high risk for hepatitis B if: ? You were born in a country where hepatitis B is common. Ask your health care provider which countries are considered high risk. ? Your parents were born in a high-risk country, and you have not been immunized against hepatitis B (hepatitis B vaccine). ? You have HIV or AIDS. ? You use needles to inject street drugs. ? You live with someone who has hepatitis B. ? You have had sex with someone who has hepatitis B. ? You get hemodialysis treatment. ? You take certain medicines for conditions, including cancer, organ transplantation, and autoimmune conditions.  Hepatitis C  Blood testing is recommended for: ? Everyone born from [birth year] through 1963. ? Anyone with known risk factors for hepatitis C.  Sexually transmitted infections (STIs)  You should be screened for sexually transmitted infections (STIs) including gonorrhea and chlamydia if: ? You are sexually active and are younger than 60 years of age. ? You are older than 60 years of age and your health care provider tells you that you are at risk for this type of infection. ? Your sexual activity has changed since you were last screened and you are at an increased risk for chlamydia or gonorrhea. Ask your health care provider if you are at risk.  If you do not have HIV, but are at risk, it may be recommended  that you take a prescription medicine daily to prevent HIV infection. This is called pre-exposure prophylaxis (PrEP). You are considered at risk if: ? You are sexually active and do not regularly use condoms or know the HIV status of your partner(s). ? You take drugs by injection. ? You are sexually active with a partner who has HIV.  Talk with your health care provider about whether you are at high risk of being infected with HIV. If you choose to  begin PrEP, you should first be tested for HIV. You should then be tested every 3 months for as long as you are taking PrEP. Pregnancy  If you are premenopausal and you may become pregnant, ask your health care provider about preconception counseling.  If you may become pregnant, take 400 to 800 micrograms (mcg) of folic acid every day.  If you want to prevent pregnancy, talk to your health care provider about birth control (contraception). Osteoporosis and menopause  Osteoporosis is a disease in which the bones lose minerals and strength with aging. This can result in serious bone fractures. Your risk for osteoporosis can be identified using a bone density scan.  If you are 65 years of age or older, or if you are at risk for osteoporosis and fractures, ask your health care provider if you should be screened.  Ask your health care provider whether you should take a calcium or vitamin D supplement to lower your risk for osteoporosis.  Menopause may have certain physical symptoms and risks.  Hormone replacement therapy may reduce some of these symptoms and risks. Talk to your health care provider about whether hormone replacement therapy is right for you. Follow these instructions at home:  Schedule regular health, dental, and eye exams.  Stay current with your immunizations.  Do not use any tobacco products including cigarettes, chewing tobacco, or electronic cigarettes.  If you are pregnant, do not drink alcohol.  If you are  breastfeeding, limit how much and how often you drink alcohol.  Limit alcohol intake to no more than 1 drink per day for nonpregnant women. One drink equals 12 ounces of beer, 5 ounces of wine, or 1 ounces of hard liquor.  Do not use street drugs.  Do not share needles.  Ask your health care provider for help if you need support or information about quitting drugs.  Tell your health care provider if you often feel depressed.  Tell your health care provider if you have ever been abused or do not feel safe at home. This information is not intended to replace advice given to you by your health care provider. Make sure you discuss any questions you have with your health care provider. Document Released: 10/24/2010 Document Revised: 09/16/2015 Document Reviewed: 01/12/2015 [employer] Interactive Patient Education  2018 [employer]  
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
## 2017-09-11 NOTE — Patient Instructions (Signed)
It was a pleasure to see you today.  Dr. Raoul Myers  Health Maintenance, Female Adopting a healthy lifestyle and getting preventive care can go a long way to promote health and wellness. Talk with your health care provider about what schedule of regular examinations is right for you. This is a good chance for you to check in with your provider about disease prevention and staying healthy. In between checkups, there are plenty of things you can do on your own. Experts have done a lot of research about which lifestyle changes and preventive measures are most likely to keep you healthy. Ask your health care provider for more information. Weight and diet Eat a healthy diet  Be sure to include plenty of vegetables, fruits, low-fat dairy products, and lean protein.  Do not eat a lot of foods high in solid fats, added sugars, or salt.  Get regular exercise. This is one of the most important things you can do for your health. ? Most adults should exercise for at least 150 minutes each week. The exercise should increase your heart rate and make you sweat (moderate-intensity exercise). ? Most adults should also do strengthening exercises at least twice a week. This is in addition to the moderate-intensity exercise.  Maintain a healthy weight  Body mass index (BMI) is a measurement that can be used to identify possible weight problems. It estimates body fat based on height and weight. Your health care provider can help determine your BMI and help you achieve or maintain a healthy weight.  For females 46 years of age and older: ? A BMI below 18.5 is considered underweight. ? A BMI of 18.5 to 24.9 is normal. ? A BMI of 25 to 29.9 is considered overweight. ? A BMI of 30 and above is considered obese.  Watch levels of cholesterol and blood lipids  You should start having your blood tested for lipids and cholesterol at 60 years of age, then have this test every 5 years.  You may need to have your  cholesterol levels checked more often if: ? Your lipid or cholesterol levels are high. ? You are older than 60 years of age. ? You are at high risk for heart disease.  Cancer screening Lung Cancer  Lung cancer screening is recommended for adults 62-65 years old who are at high risk for lung cancer because of a history of smoking.  A yearly low-dose CT scan of the lungs is recommended for people who: ? Currently smoke. ? Have quit within the past 15 years. ? Have at least a 30-pack-year history of smoking. A pack year is smoking an average of one pack of cigarettes a day for 1 year.  Yearly screening should continue until it has been 15 years since you quit.  Yearly screening should stop if you develop a health problem that would prevent you from having lung cancer treatment.  Breast Cancer  Practice breast self-awareness. This means understanding how your breasts normally appear and feel.  It also means doing regular breast self-exams. Let your health care provider know about any changes, no matter how small.  If you are in your 20s or 30s, you should have a clinical breast exam (CBE) by a health care provider every 1-3 years as part of a regular health exam.  If you are 71 or older, have a CBE every year. Also consider having a breast X-ray (mammogram) every year.  If you have a family history of breast cancer, talk to your health  provider about genetic screening.  If you are at high risk for breast cancer, talk to your health care provider about having an MRI and a mammogram every year.  Breast cancer gene (BRCA) assessment is recommended for women who have family members with BRCA-related cancers. BRCA-related cancers include: ? Breast. ? Ovarian. ? Tubal. ? Peritoneal cancers.  Results of the assessment will determine the need for genetic counseling and BRCA1 and BRCA2 testing.  Cervical Cancer Your health care provider may recommend that you be screened regularly for cancer of  the pelvic organs (ovaries, uterus, and vagina). This screening involves a pelvic examination, including checking for microscopic changes to the surface of your cervix (Pap test). You may be encouraged to have this screening done every 3 years, beginning at age 21.  For women ages 30-65, health care providers may recommend pelvic exams and Pap testing every 3 years, or they may recommend the Pap and pelvic exam, combined with testing for human papilloma virus (HPV), every 5 years. Some types of HPV increase your risk of cervical cancer. Testing for HPV may also be done on women of any age with unclear Pap test results.  Other health care providers may not recommend any screening for nonpregnant women who are considered low risk for pelvic cancer and who do not have symptoms. Ask your health care provider if a screening pelvic exam is right for you.  If you have had past treatment for cervical cancer or a condition that could lead to cancer, you need Pap tests and screening for cancer for at least 20 years after your treatment. If Pap tests have been discontinued, your risk factors (such as having a new sexual partner) need to be reassessed to determine if screening should resume. Some women have medical problems that increase the chance of getting cervical cancer. In these cases, your health care provider may recommend more frequent screening and Pap tests.  Colorectal Cancer  This type of cancer can be detected and often prevented.  Routine colorectal cancer screening usually begins at 60 years of age and continues through 60 years of age.  Your health care provider may recommend screening at an earlier age if you have risk factors for colon cancer.  Your health care provider may also recommend using home test kits to check for hidden blood in the stool.  A small camera at the end of a tube can be used to examine your colon directly (sigmoidoscopy or colonoscopy). This is done to check for the  earliest forms of colorectal cancer.  Routine screening usually begins at age 50.  Direct examination of the colon should be repeated every 5-10 years through 60 years of age. However, you may need to be screened more often if early forms of precancerous polyps or small growths are found.  Skin Cancer  Check your skin from head to toe regularly.  Tell your health care provider about any new moles or changes in moles, especially if there is a change in a mole's shape or color.  Also tell your health care provider if you have a mole that is larger than the size of a pencil eraser.  Always use sunscreen. Apply sunscreen liberally and repeatedly throughout the day.  Protect yourself by wearing long sleeves, pants, a wide-brimmed hat, and sunglasses whenever you are outside.  Heart disease, diabetes, and high blood pressure  High blood pressure causes heart disease and increases the risk of stroke. High blood pressure is more likely to develop in: ?   People who have blood pressure in the high end of the normal range (130-139/85-89 mm Hg). ? People who are overweight or obese. ? People who are African American.  If you are 18-39 years of age, have your blood pressure checked every 3-5 years. If you are 40 years of age or older, have your blood pressure checked every year. You should have your blood pressure measured twice-once when you are at a hospital or clinic, and once when you are not at a hospital or clinic. Record the average of the two measurements. To check your blood pressure when you are not at a hospital or clinic, you can use: ? An automated blood pressure machine at a pharmacy. ? A home blood pressure monitor.  If you are between 55 years and 79 years old, ask your health care provider if you should take aspirin to prevent strokes.  Have regular diabetes screenings. This involves taking a blood sample to check your fasting blood sugar level. ? If you are at a normal weight and  have a low risk for diabetes, have this test once every three years after 60 years of age. ? If you are overweight and have a high risk for diabetes, consider being tested at a younger age or more often. Preventing infection Hepatitis B  If you have a higher risk for hepatitis B, you should be screened for this virus. You are considered at high risk for hepatitis B if: ? You were born in a country where hepatitis B is common. Ask your health care provider which countries are considered high risk. ? Your parents were born in a high-risk country, and you have not been immunized against hepatitis B (hepatitis B vaccine). ? You have HIV or AIDS. ? You use needles to inject street drugs. ? You live with someone who has hepatitis B. ? You have had sex with someone who has hepatitis B. ? You get hemodialysis treatment. ? You take certain medicines for conditions, including cancer, organ transplantation, and autoimmune conditions.  Hepatitis C  Blood testing is recommended for: ? Everyone born from 1945 through 1965. ? Anyone with known risk factors for hepatitis C.  Sexually transmitted infections (STIs)  You should be screened for sexually transmitted infections (STIs) including gonorrhea and chlamydia if: ? You are sexually active and are younger than 60 years of age. ? You are older than 60 years of age and your health care provider tells you that you are at risk for this type of infection. ? Your sexual activity has changed since you were last screened and you are at an increased risk for chlamydia or gonorrhea. Ask your health care provider if you are at risk.  If you do not have HIV, but are at risk, it may be recommended that you take a prescription medicine daily to prevent HIV infection. This is called pre-exposure prophylaxis (PrEP). You are considered at risk if: ? You are sexually active and do not regularly use condoms or know the HIV status of your partner(s). ? You take drugs by  injection. ? You are sexually active with a partner who has HIV.  Talk with your health care provider about whether you are at high risk of being infected with HIV. If you choose to begin PrEP, you should first be tested for HIV. You should then be tested every 3 months for as long as you are taking PrEP. Pregnancy  If you are premenopausal and you may become pregnant, ask your health   your health care provider about preconception counseling.  If you may become pregnant, take 400 to 800 micrograms (mcg) of folic acid every day.  If you want to prevent pregnancy, talk to your health care provider about birth control (contraception). Osteoporosis and menopause  Osteoporosis is a disease in which the bones lose minerals and strength with aging. This can result in serious bone fractures. Your risk for osteoporosis can be identified using a bone density scan.  If you are 13 years of age or older, or if you are at risk for osteoporosis and fractures, ask your health care provider if you should be screened.  Ask your health care provider whether you should take a calcium or vitamin D supplement to lower your risk for osteoporosis.  Menopause may have certain physical symptoms and risks.  Hormone replacement therapy may reduce some of these symptoms and risks. Talk to your health care provider about whether hormone replacement therapy is right for you. Follow these instructions at home:  Schedule regular health, dental, and eye exams.  Stay current with your immunizations.  Do not use any tobacco products including cigarettes, chewing tobacco, or electronic cigarettes.  If you are pregnant, do not drink alcohol.  If you are breastfeeding, limit how much and how often you drink alcohol.  Limit alcohol intake to no more than 1 drink per day for nonpregnant women. One drink equals 12 ounces of beer, 5 ounces of wine, or 1 ounces of hard liquor.  Do not use street  drugs.  Do not share needles.  Ask your health care provider for help if you need support or information about quitting drugs.  Tell your health care provider if you often feel depressed.  Tell your health care provider if you have ever been abused or do not feel safe at home. This information is not intended to replace advice given to you by your health care provider. Make sure you discuss any questions you have with your health care provider. Document Released: 10/24/2010 Document Revised: 09/16/2015 Document Reviewed: 01/12/2015 Elsevier Interactive Patient Education  [employer].

## 2017-09-12 ENCOUNTER — Telehealth: Payer: Self-pay | Admitting: Family Medicine

## 2017-09-12 DIAGNOSIS — M79674 Pain in right toe(s): Secondary | ICD-10-CM | POA: Diagnosis not present

## 2017-09-12 DIAGNOSIS — M79675 Pain in left toe(s): Secondary | ICD-10-CM | POA: Diagnosis not present

## 2017-09-12 DIAGNOSIS — B351 Tinea unguium: Secondary | ICD-10-CM | POA: Diagnosis not present

## 2017-09-12 NOTE — Telephone Encounter (Signed)
Please inform patient the following information: Please inform pt her labs look good.  Vit D is at a good level, continue supplement at current dose.  Cholesterol is about the same as last year. We could  start a low dose statin medication since she has been dieting and exercising OR she can increase her fish oil to 3000 mg a day and follow up in 6 month (can be her HYN visit) If we start the statin, follow up in 3 mos with provider apt for refill and recheck on lipid level (fasting)

## 2017-09-12 NOTE — Telephone Encounter (Signed)
Spoke with patient reviewed lab results and instructions. Patient verbalized understanding. Patient prefers to try increase in fish oil. She will call back to schedule her 6 month follow up.

## 2017-09-13 ENCOUNTER — Encounter: Payer: Self-pay | Admitting: Family Medicine

## 2017-09-25 ENCOUNTER — Encounter: Payer: Self-pay | Admitting: Family Medicine

## 2017-10-03 DIAGNOSIS — L600 Ingrowing nail: Secondary | ICD-10-CM | POA: Diagnosis not present

## 2017-10-03 DIAGNOSIS — M79674 Pain in right toe(s): Secondary | ICD-10-CM | POA: Diagnosis not present

## 2017-10-03 DIAGNOSIS — M79675 Pain in left toe(s): Secondary | ICD-10-CM | POA: Diagnosis not present

## 2017-10-12 ENCOUNTER — Other Ambulatory Visit: Payer: Self-pay | Admitting: Family Medicine

## 2017-10-12 DIAGNOSIS — I10 Essential (primary) hypertension: Secondary | ICD-10-CM

## 2017-10-24 ENCOUNTER — Encounter: Payer: Self-pay | Admitting: Gastroenterology

## 2017-11-26 DIAGNOSIS — Z0100 Encounter for examination of eyes and vision without abnormal findings: Secondary | ICD-10-CM | POA: Diagnosis not present

## 2017-12-17 ENCOUNTER — Ambulatory Visit (AMBULATORY_SURGERY_CENTER): Payer: Self-pay

## 2017-12-17 VITALS — Ht 66.5 in | Wt 193.2 lb

## 2017-12-17 DIAGNOSIS — Z86010 Personal history of colonic polyps: Secondary | ICD-10-CM

## 2017-12-17 MED ORDER — NA SULFATE-K SULFATE-MG SULF 17.5-3.13-1.6 GM/177ML PO SOLN: 1.0000 | Freq: Once | ORAL | 0 refills | Status: AC

## 2017-12-17 NOTE — Progress Notes (Signed)
Denies allergies to eggs or soy products. Denies complication of anesthesia or sedation. Denies use of weight loss medication. Denies use of O2.   Emmi instructions declined.  

## 2017-12-19 ENCOUNTER — Encounter: Payer: Self-pay | Admitting: Gastroenterology

## 2018-01-01 ENCOUNTER — Encounter: Payer: Self-pay | Admitting: Gastroenterology

## 2018-01-01 ENCOUNTER — Ambulatory Visit (AMBULATORY_SURGERY_CENTER): Payer: Medicare HMO | Admitting: Gastroenterology

## 2018-01-01 VITALS — BP 147/63 | HR 57 | Temp 98.6°F | Resp 27 | Ht 66.5 in | Wt 193.0 lb

## 2018-01-01 DIAGNOSIS — Z86010 Personal history of colonic polyps: Secondary | ICD-10-CM | POA: Diagnosis not present

## 2018-01-01 DIAGNOSIS — Z1211 Encounter for screening for malignant neoplasm of colon: Secondary | ICD-10-CM | POA: Diagnosis not present

## 2018-01-01 MED ORDER — SODIUM CHLORIDE 0.9 % IV SOLN: 500.0000 mL | Freq: Once | INTRAVENOUS | Status: DC

## 2018-01-01 NOTE — Patient Instructions (Signed)
   Information on diverticulosis and high fiber diet given to you today    YOU HAD AN ENDOSCOPIC PROCEDURE TODAY AT [facility]:   Refer to the procedure report that was given to you for any specific questions about what was found during the examination.  If the procedure report does not answer your questions, please call your gastroenterologist to clarify.  If you requested that your care partner not be given the details of your procedure findings, then the procedure report has been included in a sealed envelope for you to review at your convenience later.  YOU SHOULD EXPECT: Some feelings of bloating in the abdomen. Passage of more gas than usual.  Walking can help get rid of the air that was put into your GI tract during the procedure and reduce the bloating. If you had a lower endoscopy (such as a colonoscopy or flexible sigmoidoscopy) you may notice spotting of blood in your stool or on the toilet paper. If you underwent a bowel prep for your procedure, you may not have a normal bowel movement for a few days.  Please Note:  You might notice some irritation and congestion in your nose or some drainage.  This is from the oxygen used during your procedure.  There is no need for concern and it should clear up in a day or so.  SYMPTOMS TO REPORT IMMEDIATELY:   Following lower endoscopy (colonoscopy or flexible sigmoidoscopy):  Excessive amounts of blood in the stool  Significant tenderness or worsening of abdominal pains  Swelling of the abdomen that is new, acute  Fever of 100F or higher    For urgent or emergent issues, a gastroenterologist can be reached at any hour by calling [phone].   DIET:  We do recommend a small meal at first, but then you may proceed to your regular diet.  Drink plenty of fluids but you should avoid alcoholic beverages for 24 hours.  ACTIVITY:  You should plan to take it easy for the rest of today and you should NOT DRIVE or use heavy  machinery until tomorrow (because of the sedation medicines used during the test).    FOLLOW UP: Our staff will call the number listed on your records the next business day following your procedure to check on you and address any questions or concerns that you may have regarding the information given to you following your procedure. If we do not reach you, we will leave a message.  However, if you are feeling well and you are not experiencing any problems, there is no need to return our call.  We will assume that you have returned to your regular daily activities without incident.  If any biopsies were taken you will be contacted by phone or by letter within the next 1-3 weeks.  Please call us at [phone] if you have not heard about the biopsies in 3 weeks.    SIGNATURES/CONFIDENTIALITY: You and/or your care partner have signed paperwork which will be entered into your electronic medical record.  These signatures attest to the fact that that the information above on your After Visit Summary has been reviewed and is understood.  Full responsibility of the confidentiality of this discharge information lies with you and/or your care-partner.

## 2018-01-01 NOTE — Op Note (Signed)
[facility] Patient Name: Brittney Myers Procedure Date: 01/01/2018 10:19 AM MRN: [id number] Endoscopist: Ladene Artist , MD Age: 62 Referring MD:  Date of Birth: November 20, 1955 Gender: Female Account #: [id number] Procedure:                Colonoscopy Indications:              High risk colon cancer surveillance: Personal                            history of traditional serrated adenoma of the colon Medicines:                Monitored Anesthesia Care Procedure:                Pre-Anesthesia Assessment:                           - Prior to the procedure, a History and Physical                            was performed, and patient medications and                            allergies were reviewed. The patient's tolerance of                            previous anesthesia was also reviewed. The risks                            and benefits of the procedure and the sedation                            options and risks were discussed with the patient.                            All questions were answered, and informed consent                            was obtained. Prior Anticoagulants: The patient has                            taken no previous anticoagulant or antiplatelet                            agents. ASA Grade Assessment: II - A patient with                            mild systemic disease. After reviewing the risks                            and benefits, the patient was deemed in                            satisfactory condition to undergo the procedure.  After obtaining informed consent, the colonoscope                            was passed under direct vision. Throughout the                            procedure, the patient's blood pressure, pulse, and                            oxygen saturations were monitored continuously. The                            Colonoscope was introduced through the anus and                            advanced to  the the cecum, identified by                            appendiceal orifice and ileocecal valve. The                            ileocecal valve, appendiceal orifice, and rectum                            were photographed. The quality of the bowel                            preparation was excellent. The colonoscopy was                            performed without difficulty. The patient tolerated                            the procedure well. Scope In: 10:25:08 AM Scope Out: 10:37:08 AM Scope Withdrawal Time: 0 hours 9 minutes 8 seconds  Total Procedure Duration: 0 hours 12 minutes 0 seconds  Findings:                 The perianal and digital rectal examinations were                            normal.                           A few small-mouthed diverticula were found in the                            sigmoid colon.                           The exam was otherwise without abnormality on                            direct and retroflexion views. Complications:            No immediate complications. Estimated blood loss:  None. Estimated Blood Loss:     Estimated blood loss: none. Impression:               - Diverticulosis in the sigmoid colon.                           - The examination was otherwise normal on direct                            and retroflexion views.                           - No specimens collected. Recommendation:           - Repeat colonoscopy in 5 years for surveillance.                           - Patient has a contact number available for                            emergencies. The signs and symptoms of potential                            delayed complications were discussed with the                            patient. Return to normal activities tomorrow.                            Written discharge instructions were provided to the                            patient.                           - Resume previous diet.                            - Continue present medications. Ladene Artist, MD 01/01/2018 10:43:37 AM This report has been signed electronically.

## 2018-01-01 NOTE — Progress Notes (Signed)
Pt's states no medical or surgical changes since previsit or office visit. 

## 2018-01-01 NOTE — Progress Notes (Signed)
A and O x3. Report to RN. Tolerated MAC anesthesia well.

## 2018-01-02 ENCOUNTER — Telehealth: Payer: Self-pay

## 2018-01-02 NOTE — Telephone Encounter (Signed)
  Follow up Call-  Call back number 01/01/2018 01/01/2018  Post procedure Call Back phone  # [phone] -  Permission to leave phone message Yes Yes  Some recent data might be hidden     Patient questions:  Do you have a fever, pain , or abdominal swelling? No. Pain Score  0 *  Have you tolerated food without any problems? Yes.    Have you been able to return to your normal activities? Yes.    Do you have any questions about your discharge instructions: Diet   No. Medications  No. Follow up visit  No.  Do you have questions or concerns about your Care? No.  Actions: * If pain score is 4 or above: No action needed, pain <4.

## 2018-01-15 ENCOUNTER — Ambulatory Visit (INDEPENDENT_AMBULATORY_CARE_PROVIDER_SITE_OTHER): Payer: Medicare HMO

## 2018-01-15 DIAGNOSIS — Z23 Encounter for immunization: Secondary | ICD-10-CM

## 2018-01-28 ENCOUNTER — Encounter: Payer: Self-pay | Admitting: Family Medicine

## 2018-01-28 ENCOUNTER — Ambulatory Visit (INDEPENDENT_AMBULATORY_CARE_PROVIDER_SITE_OTHER): Payer: Medicare HMO | Admitting: Family Medicine

## 2018-01-28 VITALS — BP 121/82 | HR 66 | Temp 98.0°F | Resp 20 | Ht 66.5 in | Wt 193.0 lb

## 2018-01-28 DIAGNOSIS — J0100 Acute maxillary sinusitis, unspecified: Secondary | ICD-10-CM | POA: Diagnosis not present

## 2018-01-28 MED ORDER — FLUTICASONE PROPIONATE 50 MCG/ACT NA SUSP: 2.0000 | Freq: Every day | NASAL | 6 refills | Status: DC

## 2018-01-28 MED ORDER — ALBUTEROL SULFATE HFA 108 (90 BASE) MCG/ACT IN AERS: 2.0000 | INHALATION_SPRAY | Freq: Four times a day (QID) | RESPIRATORY_TRACT | 0 refills | Status: DC | PRN

## 2018-01-28 MED ORDER — BENZONATATE 100 MG PO CAPS: 100.0000 mg | ORAL_CAPSULE | Freq: Two times a day (BID) | ORAL | 0 refills | Status: DC | PRN

## 2018-01-28 MED ORDER — DOXYCYCLINE HYCLATE 100 MG PO TABS: 100.0000 mg | ORAL_TABLET | Freq: Two times a day (BID) | ORAL | 0 refills | Status: DC
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
## 2018-01-28 NOTE — Patient Instructions (Addendum)
This is likely viral right now--> wait a few days and if worsening then start antibiotic. Rest, hydrate.  + flonase (prescribed), mucinex (DM if cough), nettie pot or nasal saline.  Prescribed tessalon perles for cough.  Albuterol refilled.  Doxycyline  prescribed, take until completed if started--> do not start unless symptoms are not improved in 4 days or symptoms worsening.   If cough present it can last up to 6-8 weeks.  F/U 2 weeks of not improved.    Sinusitis, Adult Sinusitis is soreness and inflammation of your sinuses. Sinuses are hollow spaces in the bones around your face. They are located:  Around your eyes.  In the middle of your forehead.  Behind your nose.  In your cheekbones.  Your sinuses and nasal passages are lined with a stringy fluid (mucus). Mucus normally drains out of your sinuses. When your nasal tissues get inflamed or swollen, the mucus can get trapped or blocked so air cannot flow through your sinuses. This lets bacteria, viruses, and funguses grow, and that leads to infection. Follow these instructions at home: Medicines  Take, use, or apply over-the-counter and prescription medicines only as told by your doctor. These may include nasal sprays.  If you were prescribed an antibiotic medicine, take it as told by your doctor. Do not stop taking the antibiotic even if you start to feel better. Hydrate and Humidify  Drink enough water to keep your pee (urine) clear or pale yellow.  Use a cool mist humidifier to keep the humidity level in your home above 50%.  Breathe in steam for 10-15 minutes, 3-4 times a day or as told by your doctor. You can do this in the bathroom while a hot shower is running.  Try not to spend time in cool or dry air. Rest  Rest as much as possible.  Sleep with your head raised (elevated).  Make sure to get enough sleep each night. General instructions  Put a warm, moist washcloth on your face 3-4 times a day or as told by  your doctor. This will help with discomfort.  Wash your hands often with soap and water. If there is no soap and water, use hand sanitizer.  Do not smoke. Avoid being around people who are smoking (secondhand smoke).  Keep all follow-up visits as told by your doctor. This is important. Contact a doctor if:  You have a fever.  Your symptoms get worse.  Your symptoms do not get better within 10 days. Get help right away if:  You have a very bad headache.  You cannot stop throwing up (vomiting).  You have pain or swelling around your face or eyes.  You have trouble seeing.  You feel confused.  Your neck is stiff.  You have trouble breathing. This information is not intended to replace advice given to you by your health care provider. Make sure you discuss any questions you have with your health care provider. Document Released: 09/27/2007 Document Revised: 12/05/2015 Document Reviewed: 02/03/2015 Elsevier Interactive Patient Education  [employer].

## 2018-02-28 ENCOUNTER — Encounter: Payer: Self-pay | Admitting: Family Medicine

## 2018-02-28 ENCOUNTER — Ambulatory Visit (INDEPENDENT_AMBULATORY_CARE_PROVIDER_SITE_OTHER): Payer: Medicare HMO | Admitting: Family Medicine

## 2018-02-28 VITALS — BP 116/78 | HR 80 | Temp 97.7°F | Resp 20 | Ht 67.0 in | Wt 192.0 lb

## 2018-02-28 DIAGNOSIS — M1990 Unspecified osteoarthritis, unspecified site: Secondary | ICD-10-CM

## 2018-02-28 DIAGNOSIS — E7800 Pure hypercholesterolemia, unspecified: Secondary | ICD-10-CM | POA: Diagnosis not present

## 2018-02-28 DIAGNOSIS — I10 Essential (primary) hypertension: Secondary | ICD-10-CM

## 2018-02-28 MED ORDER — HYDROCHLOROTHIAZIDE 25 MG PO TABS: 25.0000 mg | ORAL_TABLET | Freq: Every day | ORAL | 1 refills | Status: DC

## 2018-02-28 MED ORDER — DICLOFENAC SODIUM ER 100 MG PO TB24: 100.0000 mg | ORAL_TABLET | Freq: Every day | ORAL | 3 refills | Status: DC
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
## 2018-02-28 NOTE — Patient Instructions (Signed)
BP looks great. Refilled meds.  Remember to try capsicin creme to your thumb.  physical in 6 mos.   Please help [place] help you:  We are honored you have chosen [facility] for your Primary Care home. Below you will find basic instructions that you may need to access in the future. Please help [place] help you by reading the instructions, which cover many of the frequent questions we experience.   Prescription refills and request:  -In order to allow more efficient response time, please call your pharmacy for all refills. They will forward the request electronically to [place]. This allows for the quickest possible response. Request left on a nurse line can take longer to refill, since these are checked as time allows between office patients and other phone calls.  - refill request can take up to 3-5 working days to complete.  - If request is sent electronically and request is appropiate, it is usually completed in 1-2 business days.  - all patients will need to be seen routinely for all chronic medical conditions requiring prescription medications (see follow-up below). If you are overdue for follow up on your condition, you will be asked to make an appointment and we will call in enough medication to cover you until your appointment (up to 30 days).  - all controlled substances will require a face to face visit to request/refill.  - if you desire your prescriptions to go through a new pharmacy, and have an active script at original pharmacy, you will need to call your pharmacy and have scripts transferred to new pharmacy. This is completed between the pharmacy locations and not by your provider.    Results: If any images or labs were ordered, it can take up to 1 week to get results depending on the test ordered and the lab/facility running and resulting the test. - Normal or stable results, which do not need further discussion, may be released to your mychart immediately with attached note to you. A call  may not be generated for normal results. Please make certain to sign up for mychart. If you have questions on how to activate your mychart you can call the front office.  - If your results need further discussion, our office will attempt to contact you via phone, and if unable to reach you after 2 attempts, we will release your abnormal result to your mychart with instructions.  - All results will be automatically released in mychart after 1 week.  - Your provider will provide you with explanation and instruction on all relevant material in your results. Please keep in mind, results and labs may appear confusing or abnormal to the untrained eye, but it does not mean they are actually abnormal for you personally. If you have any questions about your results that are not covered, or you desire more detailed explanation than what was provided, you should make an appointment with your provider to do so.   Our office handles many outgoing and incoming calls daily. If we have not contacted you within 1 week about your results, please check your mychart to see if there is a message first and if not, then contact our office.  In helping with this matter, you help decrease call volume, and therefore allow [place] to be able to respond to patients needs more efficiently.   Acute office visits (sick visit):  An acute visit is intended for a new problem and are scheduled in shorter time slots to allow schedule openings  for patients with new problems. This is the appropriate visit to discuss a new problem. Problems will not be addressed by phone call or Echart message. Appointment is needed if requesting treatment. In order to provide you with excellent quality medical care with proper time for you to explain your problem, have an exam and receive treatment with instructions, these appointments should be limited to one new problem per visit. If you experience a new problem, in which you desire to be addressed, please make an  acute office visit, we save openings on the schedule to accommodate you. Please do not save your new problem for any other type of visit, let us take care of it properly and quickly for you.   Follow up visits:  Depending on your condition(s) your provider will need to see you routinely in order to provide you with quality care and prescribe medication(s). Most chronic conditions (Example: hypertension, Diabetes, depression/anxiety... etc), require visits a couple times a year. Your provider will instruct you on proper follow up for your personal medical conditions and history. Please make certain to make follow up appointments for your condition as instructed. Failing to do so could result in lapse in your medication treatment/refills. If you request a refill, and are overdue to be seen on a condition, we will always provide you with a 30 day script (once) to allow you time to schedule.    Medicare wellness (well visit): - we have a wonderful Nurse Maudie Mercury), that will meet with you and provide you will yearly medicare wellness visits. These visits should occur yearly (can not be scheduled less than 1 calendar year apart) and cover preventive health, immunizations, advance directives and screenings you are entitled to yearly through your medicare benefits. Do not miss out on your entitled benefits, this is when medicare will pay for these benefits to be ordered for you.  These are strongly encouraged by your provider and is the appropriate type of visit to make certain you are up to date with all preventive health benefits. If you have not had your medicare wellness exam in the last 12 months, please make certain to schedule one by calling the office and schedule your medicare wellness with Maudie Mercury as soon as possible.   Yearly physical (well visit):  - Adults are recommended to be seen yearly for physicals. Check with your insurance and date of your last physical, most insurances require one calendar year  between physicals. Physicals include all preventive health topics, screenings, medical exam and labs that are appropriate for gender/age and history. You may have fasting labs needed at this visit. This is a well visit (not a sick visit), new problems should not be covered during this visit (see acute visit).  - Pediatric patients are seen more frequently when they are younger. Your provider will advise you on well child visit timing that is appropriate for your their age. - This is not a medicare wellness visit. Medicare wellness exams do not have an exam portion to the visit. Some medicare companies allow for a physical, some do not allow a yearly physical. If your medicare allows a yearly physical you can schedule the medicare wellness with our nurse Maudie Mercury and have your physical with your provider after, on the same day. Please check with insurance for your full benefits.   Late Policy/No Shows:  - all new patients should arrive 15-30 minutes earlier than appointment to allow [place] time  to  obtain all personal demographics,  insurance information and  for you to complete office paperwork. - All established patients should arrive 10-15 minutes earlier than appointment time to update all information and be checked in .  - In our best efforts to run on time, if you are late for your appointment you will be asked to either reschedule or if able, we will work you back into the schedule. There will be a wait time to work you back in the schedule,  depending on availability.  - If you are unable to make it to your appointment as scheduled, please call 24 hours ahead of time to allow [place] to fill the time slot with someone else who needs to be seen. If you do not cancel your appointment ahead of time, you may be charged a no show fee.

## 2018-03-14 ENCOUNTER — Ambulatory Visit (INDEPENDENT_AMBULATORY_CARE_PROVIDER_SITE_OTHER): Payer: Medicare HMO | Admitting: Family Medicine

## 2018-03-14 ENCOUNTER — Encounter: Payer: Self-pay | Admitting: Family Medicine

## 2018-03-14 VITALS — BP 135/87 | HR 73 | Temp 98.6°F | Resp 20 | Ht 67.0 in | Wt 192.0 lb

## 2018-03-14 DIAGNOSIS — J439 Emphysema, unspecified: Secondary | ICD-10-CM

## 2018-03-14 DIAGNOSIS — J4521 Mild intermittent asthma with (acute) exacerbation: Secondary | ICD-10-CM | POA: Diagnosis not present

## 2018-03-14 MED ORDER — PREDNISONE 20 MG PO TABS: ORAL_TABLET | ORAL | 0 refills | Status: DC

## 2018-03-14 MED ORDER — DOXYCYCLINE HYCLATE 100 MG PO TABS: 100.0000 mg | ORAL_TABLET | Freq: Two times a day (BID) | ORAL | 0 refills | Status: DC
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
## 2018-03-14 NOTE — Patient Instructions (Signed)
Rest, hydrate.  + flonase, mucinex (DM if cough), nettie pot or nasal saline.  Doxycyline and prednisone   prescribed, take until completed.  Use albuterol as needed If cough present it can last up to 6-8 weeks.  F/U 2 weeks of not improved.    Acute Bronchitis, Adult Acute bronchitis is when air tubes (bronchi) in the lungs suddenly get swollen. The condition can make it hard to breathe. It can also cause these symptoms:  A cough.  Coughing up clear, yellow, or green mucus.  Wheezing.  Chest congestion.  Shortness of breath.  A fever.  Body aches.  Chills.  A sore throat.  Follow these instructions at home: Medicines  Take over-the-counter and prescription medicines only as told by your doctor.  If you were prescribed an antibiotic medicine, take it as told by your doctor. Do not stop taking the antibiotic even if you start to feel better. General instructions  Rest.  Drink enough fluids to keep your pee (urine) clear or pale yellow.  Avoid smoking and secondhand smoke. If you smoke and you need help quitting, ask your doctor. Quitting will help your lungs heal faster.  Use an inhaler, cool mist vaporizer, or humidifier as told by your doctor.  Keep all follow-up visits as told by your doctor. This is important. How is this prevented? To lower your risk of getting this condition again:  Wash your hands often with soap and water. If you cannot use soap and water, use hand sanitizer.  Avoid contact with people who have cold symptoms.  Try not to touch your hands to your mouth, nose, or eyes.  Make sure to get the flu shot every year.  Contact a doctor if:  Your symptoms do not get better in 2 weeks. Get help right away if:  You cough up blood.  You have chest pain.  You have very bad shortness of breath.  You become dehydrated.  You faint (pass out) or keep feeling like you are going to pass out.  You keep throwing up (vomiting).  You have a  very bad headache.  Your fever or chills gets worse. This information is not intended to replace advice given to you by your health care provider. Make sure you discuss any questions you have with your health care provider. Document Released: 09/27/2007 Document Revised: 11/17/2015 Document Reviewed: 09/29/2015 Elsevier Interactive Patient Education  2018 [employer].    Sinusitis, Adult Sinusitis is soreness and inflammation of your sinuses. Sinuses are hollow spaces in the bones around your face. They are located:  Around your eyes.  In the middle of your forehead.  Behind your nose.  In your cheekbones.  Your sinuses and nasal passages are lined with a stringy fluid (mucus). Mucus normally drains out of your sinuses. When your nasal tissues get inflamed or swollen, the mucus can get trapped or blocked so air cannot flow through your sinuses. This lets bacteria, viruses, and funguses grow, and that leads to infection. Follow these instructions at home: Medicines  Take, use, or apply over-the-counter and prescription medicines only as told by your doctor. These may include nasal sprays.  If you were prescribed an antibiotic medicine, take it as told by your doctor. Do not stop taking the antibiotic even if you start to feel better. Hydrate and Humidify  Drink enough water to keep your pee (urine) clear or pale yellow.  Use a cool mist humidifier to keep the humidity level in your home above 50%.  Breathe in steam for 10-15 minutes, 3-4 times a day or as told by your doctor. You can do this in the bathroom while a hot shower is running.  Try not to spend time in cool or dry air. Rest  Rest as much as possible.  Sleep with your head raised (elevated).  Make sure to get enough sleep each night. General instructions  Put a warm, moist washcloth on your face 3-4 times a day or as told by your doctor. This will help with discomfort.  Wash your hands often with soap and  water. If there is no soap and water, use hand sanitizer.  Do not smoke. Avoid being around people who are smoking (secondhand smoke).  Keep all follow-up visits as told by your doctor. This is important. Contact a doctor if:  You have a fever.  Your symptoms get worse.  Your symptoms do not get better within 10 days. Get help right away if:  You have a very bad headache.  You cannot stop throwing up (vomiting).  You have pain or swelling around your face or eyes.  You have trouble seeing.  You feel confused.  Your neck is stiff.  You have trouble breathing. This information is not intended to replace advice given to you by your health care provider. Make sure you discuss any questions you have with your health care provider. Document Released: 09/27/2007 Document Revised: 12/05/2015 Document Reviewed: 02/03/2015 Elsevier Interactive Patient Education  [employer].

## 2018-03-15 ENCOUNTER — Encounter: Payer: Self-pay | Admitting: Family Medicine

## 2018-06-25 ENCOUNTER — Other Ambulatory Visit: Payer: Self-pay | Admitting: Family Medicine

## 2018-06-25 DIAGNOSIS — Z1231 Encounter for screening mammogram for malignant neoplasm of breast: Secondary | ICD-10-CM

## 2018-07-15 ENCOUNTER — Telehealth (INDEPENDENT_AMBULATORY_CARE_PROVIDER_SITE_OTHER): Payer: Self-pay | Admitting: Physician Assistant

## 2018-07-15 ENCOUNTER — Encounter (INDEPENDENT_AMBULATORY_CARE_PROVIDER_SITE_OTHER): Payer: Self-pay | Admitting: Physician Assistant

## 2018-07-15 ENCOUNTER — Encounter: Payer: Self-pay | Admitting: Family Medicine

## 2018-07-15 DIAGNOSIS — J029 Acute pharyngitis, unspecified: Secondary | ICD-10-CM

## 2018-07-15 DIAGNOSIS — J00 Acute nasopharyngitis [common cold]: Secondary | ICD-10-CM

## 2018-07-15 MED ORDER — FLUTICASONE PROPIONATE 50 MCG/ACT NA SUSP: 2.0000 | Freq: Every day | NASAL | 6 refills | Status: DC

## 2018-07-15 MED ORDER — AZITHROMYCIN 250 MG PO TABS: ORAL_TABLET | ORAL | 0 refills | Status: DC

## 2018-07-15 NOTE — Progress Notes (Signed)
We are sorry that you are not feeling well.  Here is how we plan to help!  Based on what you have shared with me it is likely that you have pharyngitis. This may not be bacterial in nature, however, due to pain with swallowing, I have prescribed antibiotics. The current duration of your symptoms dont indicate that you have a sinus infection.   Strep pharyngitis is inflammation and infection in the back of the throat.  This is an infection cause by bacteria and is treated with antibiotics.  I have prescribed Azithromycin 250 mg , take 2 pills today and then one pill days 2-5. I have also prescribed Flonase.  For throat pain, we recommend over the counter oral pain relief medications such as acetaminophen or aspirin, or anti-inflammatory medications such as ibuprofen or naproxen sodium. Topical treatments such as oral throat lozenges or sprays may be used as needed. Strep infections are not as easily transmitted as other respiratory infections, however we still recommend that you avoid close contact with loved ones, especially the very young and elderly.  Remember to wash your hands thoroughly throughout the day as this is the number one way to prevent the spread of infection and wipe down door knobs and counters with disinfectant.   Home Care:  Only take medications as instructed by your medical team.  Complete the entire course of an antibiotic.  Do not take these medications with alcohol.  A steam or ultrasonic humidifier can help congestion.  You can place a towel over your head and breathe in the steam from hot water coming from a faucet.  Avoid close contacts especially the very young and the elderly.  Cover your mouth when you cough or sneeze.  Always remember to wash your hands.  Get Help Right Away If:  You develop worsening fever or sinus pain.  You develop a severe head ache or visual changes.  Your symptoms persist after you have completed your treatment plan.  Make sure  you  Understand these instructions.  Will watch your condition.  Will get help right away if you are not doing well or get worse.  Your e-visit answers were reviewed by a board certified advanced clinical practitioner to complete your personal care plan.  Depending on the condition, your plan could have included both over the counter or prescription medications.  If there is a problem please reply  once you have received a response from your provider.  Your safety is important to [place].  If you have drug allergies check your prescription carefully.    You can use MyChart to ask questions about today's visit, request a non-urgent call back, or ask for a work or school excuse for 24 hours related to this e-Visit. If it has been greater than 24 hours you will need to follow up with your provider, or enter a new e-Visit to address those concerns.  You will get an e-mail in the next two days asking about your experience.  I hope that your e-visit has been valuable and will speed your recovery. Thank you for using e-visits.   I have spent 7 min in completion and review of this note- Lacy Duverney [facility]

## 2018-07-17 DIAGNOSIS — Z0100 Encounter for examination of eyes and vision without abnormal findings: Secondary | ICD-10-CM | POA: Diagnosis not present

## 2018-08-12 ENCOUNTER — Ambulatory Visit: Payer: Self-pay

## 2018-09-06 ENCOUNTER — Ambulatory Visit (INDEPENDENT_AMBULATORY_CARE_PROVIDER_SITE_OTHER): Payer: Medicare HMO | Admitting: Family Medicine

## 2018-09-06 ENCOUNTER — Encounter: Payer: Self-pay | Admitting: Family Medicine

## 2018-09-06 ENCOUNTER — Other Ambulatory Visit: Payer: Self-pay

## 2018-09-06 VITALS — Ht 67.0 in | Wt 184.0 lb

## 2018-09-06 DIAGNOSIS — M1990 Unspecified osteoarthritis, unspecified site: Secondary | ICD-10-CM

## 2018-09-06 DIAGNOSIS — I10 Essential (primary) hypertension: Secondary | ICD-10-CM | POA: Diagnosis not present

## 2018-09-06 DIAGNOSIS — E663 Overweight: Secondary | ICD-10-CM | POA: Diagnosis not present

## 2018-09-06 DIAGNOSIS — M4126 Other idiopathic scoliosis, lumbar region: Secondary | ICD-10-CM | POA: Diagnosis not present

## 2018-09-06 DIAGNOSIS — M349 Systemic sclerosis, unspecified: Secondary | ICD-10-CM

## 2018-09-06 DIAGNOSIS — E7800 Pure hypercholesterolemia, unspecified: Secondary | ICD-10-CM

## 2018-09-06 DIAGNOSIS — I7300 Raynaud's syndrome without gangrene: Secondary | ICD-10-CM | POA: Diagnosis not present

## 2018-09-06 MED ORDER — HYDROCHLOROTHIAZIDE 25 MG PO TABS: 25.0000 mg | ORAL_TABLET | Freq: Every day | ORAL | 1 refills | Status: DC

## 2018-09-06 NOTE — Progress Notes (Signed)
VIRTUAL VISIT VIA VIDEO-fail  I connected with Brittney Myers on 09/06/18 at 10:40 AM EDT by a video enabled telemedicine application and verified that I am speaking with the correct person using two identifiers. Location patient: Home Location provider: [facility], Office Persons participating in the virtual visit: Patient, Dr. Raoul Pitch and R.Baker, LPN  I discussed the limitations of evaluation and management by telemedicine and the availability of in person appointments. The patient expressed understanding and agreed to proceed.  Interactive audio and video telecommunications were attempted between this provider and patient, however failed, due to patient having technical difficulties OR patient did not have access to video capability. We continued and completed visit with audio only.    SUBJECTIVE Chief Complaint  Patient presents with  . Hypertension    No complaints. Needs refills. Unable to check BP at home.     HPI:  Hypertension/HLD/overweight: Patient reports compliance with HCTZ 25 mg daily. She does not check her blood pressures at home.Patient denies chest pain, shortness of breath, dizziness or lower extremity edema. She has lost 8 pounds. She finally was able to buy herself a home!!! Pt has been encouraged to take a daily baby ASA. Pt is not prescribed statin. Patient has history of scleroderma.  BMP: 08/2017 within normal limits. CBC: 08/2017 within normal limits Lipid: 08/2017 mildy elevated LDL Diet: She monitors her diet very closely. Exercise: She is working out every day. Works with silver sneakers, uses bike and is working her way up to being able to use the elliptical. RF: Hypertension, Mildly Elevated LDL, former smoker, obesity, Fhx CAD/stroke   Scoliosis /Arthritis/scleroderma/Raynauds:  Patient reports she uses diclofenac daily and this is very helpful for both her back and her knees. She is very happy with mediation. She has established with the  spine and scoliosis center. She states they recommended that she have surgery but she has declined. She is having some right 1st metacarpal pain.   Prior note: Patient is prescribed diclofenac for her arthritic pain. She is doing quite well on this medication it has changed her life. She is now able to exercise routinely. Initially her knees were her main complaint. She states she feels her scoliosis is starting to cause her more discomfort, especially when walking. She would like to be referred to a spine specialist to discuss options. She does have a history of scoliosis by MRI in 2007. Results: Scoliosis present in the lumbar spine and convex rightward in the lower lumbar spine with convex leftward curvature in the upper lumbar spine. The vertebral bodies are maintained. Marked disc space narrowing at L1. Present with associated osteophyte formation. There is mild disc space narrowing L1-L2, L3-L4, and L4-L5. Faucets were maintained at that time. The sacrum and sacroiliac joints were normal. Patient has a history of scleroderma/raynauds dating back to at least 2012 that I can see in the electronic medical record. She doesn't discuss this diagnosis much. She has had low SSA and positive ANA. ROS: See pertinent positives and negatives per HPI.  Patient Active Problem List   Diagnosis Date Noted  . Elevated LDL cholesterol level 02/28/2018  . Other idiopathic scoliosis, lumbar region 09/05/2016  . Morbid obesity ([place]) 09/08/2015  . Scleroderma, limited ([place]) 08/31/2014  . Vitamin D deficiency 08/31/2014  . Raynaud's disease 04/06/2014  . Essential hypertension 03/26/2014  . Arthritis   . COPD with emphysema ([place])     Social History   Tobacco Use  . Smoking status: Former Smoker  Packs/day: 0.50    Years: 30.00    Pack years: 15.00    Types: Cigarettes    Start date: 04/24/1973    Last attempt to quit: 08/18/2013    Years since quitting: 5.0  . Smokeless tobacco: Never Used  Substance  Use Topics  . Alcohol use: No    Alcohol/week: 0.0 standard drinks    Current Outpatient Medications:  .  albuterol (PROVENTIL HFA;VENTOLIN HFA) 108 (90 Base) MCG/ACT inhaler, Inhale 2 puffs into the lungs every 6 (six) hours as needed for wheezing or shortness of breath., Disp: 1 Inhaler, Rfl: 0 .  B Complex-C (B-COMPLEX WITH VITAMIN C) tablet, Take 1 tablet by mouth daily., Disp: , Rfl:  .  cholecalciferol (VITAMIN D) 1000 UNITS tablet, Take 2,000 Units by mouth daily., Disp: , Rfl:  .  Diclofenac Sodium CR 100 MG 24 hr tablet, Take 1 tablet (100 mg total) by mouth daily., Disp: 90 tablet, Rfl: 3 .  fluticasone (FLONASE) 50 MCG/ACT nasal spray, Place 2 sprays into both nostrils daily., Disp: 16 g, Rfl: 6 .  hydrochlorothiazide (HYDRODIURIL) 25 MG tablet, Take 1 tablet (25 mg total) by mouth daily., Disp: 90 tablet, Rfl: 1 .  Multiple Vitamin (MULTIVITAMIN) tablet, Take 1 tablet by mouth daily., Disp: , Rfl:  .  OVER THE COUNTER MEDICATION, Fish Oil, one tablet daily., Disp: , Rfl:  .  OVER THE COUNTER MEDICATION, Probiotic two capsules daily., Disp: , Rfl:   Allergies  Allergen Reactions  . Codeine Other (See Comments)    Hallucinations  . Penicillins Rash  . Prednisone Rash    OBJECTIVE: Ht 5\' 7"  (1.702 m)   Wt 184 lb (83.5 kg)   BMI 28.82 kg/m  Gen: No acute distress. Nontoxic.overweight- but lost 8 lbs CV: no edema Chest: Cough or shortness of breath not present.  Neuro:  Alert. Oriented x3  Psych: Normal affect and demeanor. Normal speech. Normal thought content and judgment.  ASSESSMENT AND PLAN: Brittney Myers is a 63 y.o. female present for  Essential hypertension/morbid obesity/elevated LDL - Stable. BP check at lab appt next week.  -  She is dieting and exercising routinely. Lost 8 lbs since she moved in to new home and doing work in the yard and around the house.  - low sodium diet. - Lab appt next week: CBC, CMP, Lipids, TSH - continue/refills- hydrochlorothiazide  (HYDRODIURIL) 25 MG tablet; Take 1 tablet (25 mg total) by mouth daily.  Dispense: 90 tablet; Refill: 1 - f/u 6 mos  Arthritis//scoliosis/ idiopathic scoliosis, lumbar region - Stable.  Continue/refill diclofenac.  - she was established w/ spine and [place], but has declined surgery. - try capsicin creme to hand/thumb.  - Follow-up 6 months     6 months CPE.   - 18 minutes spent with patient today.   Howard Pouch, DO 09/06/2018

## 2018-09-10 ENCOUNTER — Ambulatory Visit (INDEPENDENT_AMBULATORY_CARE_PROVIDER_SITE_OTHER): Payer: Medicare HMO

## 2018-09-10 ENCOUNTER — Other Ambulatory Visit: Payer: Self-pay

## 2018-09-10 DIAGNOSIS — I10 Essential (primary) hypertension: Secondary | ICD-10-CM | POA: Diagnosis not present

## 2018-09-10 DIAGNOSIS — E7800 Pure hypercholesterolemia, unspecified: Secondary | ICD-10-CM

## 2018-09-10 MED ORDER — AMLODIPINE BESYLATE 2.5 MG PO TABS: 2.5000 mg | ORAL_TABLET | Freq: Every day | ORAL | 1 refills | Status: DC

## 2018-09-10 NOTE — Progress Notes (Addendum)
Brittney Myers is a 63 y.o. female presents to the office today for Blood pressure recheck secondary to Virtual Visit on 09/06/2018, essential hypertension.  Blood pressure medication: hydrochlorothiazide (HYDRODIURIL) 25 MG tablet daily.  If on medication, Last dose was at least 1-2 hours prior to recheck: yes. Last dose 09/10/2018 @ 0730, BP checked @ 2:15pm. Blood pressure was taken in the left arm after patient rested for 10 minutes.  BP 132/68 (BP Location: Left Arm, Patient Position: Sitting, Cuff Size: Normal)   Pulse 65   SpO2 98%   [name] ______________________________________________________________ Please inform patient the following information: Her BP is borderline. It was borderline last visit as well. Called in amlodipine 2.5 mg (very low dose) . Continue HCTZ 25 mg also.   Electronically Signed by: Howard Pouch, DO [facility] [place]

## 2018-09-10 NOTE — Addendum Note (Signed)
Addended by: Howard Pouch A on: 09/10/2018 03:00 PM   Modules accepted: Orders, Level of Service

## 2018-09-10 NOTE — Progress Notes (Signed)
Pt was called and given information, she verbalized understanding  

## 2018-09-11 LAB — COMPREHENSIVE METABOLIC PANEL
AG Ratio: 1.5 (calc) (ref 1.0–2.5)
ALT: 22 U/L (ref 6–29)
AST: 27 U/L (ref 10–35)
Albumin: 4.4 g/dL (ref 3.6–5.1)
Alkaline phosphatase (APISO): 88 U/L (ref 37–153)
BUN/Creatinine Ratio: 21 (calc) (ref 6–22)
BUN: 23 mg/dL (ref 7–25)
CO2: 25 mmol/L (ref 20–32)
Calcium: 10.3 mg/dL (ref 8.6–10.4)
Chloride: 99 mmol/L (ref 98–110)
Creat: 1.1 mg/dL — ABNORMAL HIGH (ref 0.50–0.99)
Globulin: 2.9 g/dL (calc) (ref 1.9–3.7)
Glucose, Bld: 87 mg/dL (ref 65–99)
Potassium: 4.3 mmol/L (ref 3.5–5.3)
Sodium: 137 mmol/L (ref 135–146)
Total Bilirubin: 0.5 mg/dL (ref 0.2–1.2)
Total Protein: 7.3 g/dL (ref 6.1–8.1)

## 2018-09-11 LAB — CBC WITH DIFFERENTIAL/PLATELET
Absolute Monocytes: 602 {cells}/uL (ref 200–950)
Basophils Absolute: 63 {cells}/uL (ref 0–200)
Basophils Relative: 0.9 %
Eosinophils Absolute: 140 {cells}/uL (ref 15–500)
Eosinophils Relative: 2 %
HCT: 36 % (ref 35.0–45.0)
Hemoglobin: 12.4 g/dL (ref 11.7–15.5)
Lymphs Abs: 1932 {cells}/uL (ref 850–3900)
MCH: 30.5 pg (ref 27.0–33.0)
MCHC: 34.4 g/dL (ref 32.0–36.0)
MCV: 88.5 fL (ref 80.0–100.0)
MPV: 9.7 fL (ref 7.5–12.5)
Monocytes Relative: 8.6 %
Neutro Abs: 4263 {cells}/uL (ref 1500–7800)
Neutrophils Relative %: 60.9 %
Platelets: 390 Thousand/uL (ref 140–400)
RBC: 4.07 Million/uL (ref 3.80–5.10)
RDW: 12.4 % (ref 11.0–15.0)
Total Lymphocyte: 27.6 %
WBC: 7 Thousand/uL (ref 3.8–10.8)

## 2018-09-11 LAB — LIPID PANEL
Cholesterol: 236 mg/dL — ABNORMAL HIGH (ref <200)
HDL: 57 mg/dL (ref >=50)
LDL Cholesterol (Calc): 156 mg/dL — ABNORMAL HIGH
Non-HDL Cholesterol (Calc): 179 mg/dL — ABNORMAL HIGH (ref <130)
Total CHOL/HDL Ratio: 4.1 (calc) (ref <5.0)
Triglycerides: 113 mg/dL (ref <150)

## 2018-09-11 LAB — TSH: TSH: 1.16 m[IU]/L (ref 0.40–4.50)

## 2018-09-12 ENCOUNTER — Telehealth: Payer: Self-pay | Admitting: Family Medicine

## 2018-09-12 DIAGNOSIS — E782 Mixed hyperlipidemia: Secondary | ICD-10-CM

## 2018-09-12 DIAGNOSIS — Z79899 Other long term (current) drug therapy: Secondary | ICD-10-CM

## 2018-09-12 MED ORDER — ATORVASTATIN CALCIUM 20 MG PO TABS: 20.0000 mg | ORAL_TABLET | Freq: Every day | ORAL | 3 refills | Status: DC

## 2018-09-12 NOTE — Telephone Encounter (Signed)
Pt was sent My chart message with instructions.

## 2018-09-12 NOTE — Telephone Encounter (Signed)
Please inform patient the following information: Her labs are all normal with the exception of her cholesterol. Her cholesterol is higher than last year. With her age, htn, cholesterol, prior smoking history and her fhx--> it is recommend by the american heart association to add a statin medication to help lower cholesterol and provide her with cardiovascular protection from heart attack and stroke.   I have called in the medication called atorvastatin to take one pill before bed. We will repeat her lipids at her physical ( so come fasting to that in Nov).  We also need to check her liver enzymes in 8 weeks after starting statin>>> lab appt only>>> please make it now>>> order placed.

## 2018-09-13 ENCOUNTER — Encounter: Payer: Medicare HMO | Admitting: Family Medicine

## 2018-09-23 ENCOUNTER — Ambulatory Visit: Payer: Self-pay

## 2018-10-12 ENCOUNTER — Ambulatory Visit
Admission: RE | Admit: 2018-10-12 | Discharge: 2018-10-12 | Disposition: A | Discharge status: Home / Self Care | Payer: Medicare HMO | Source: Ambulatory Visit | Attending: Family Medicine | Admitting: Family Medicine

## 2018-10-12 ENCOUNTER — Other Ambulatory Visit: Payer: Self-pay

## 2018-10-12 DIAGNOSIS — Z1231 Encounter for screening mammogram for malignant neoplasm of breast: Secondary | ICD-10-CM

## 2019-01-16 ENCOUNTER — Ambulatory Visit (INDEPENDENT_AMBULATORY_CARE_PROVIDER_SITE_OTHER): Payer: Medicare HMO

## 2019-01-16 ENCOUNTER — Other Ambulatory Visit: Payer: Self-pay

## 2019-01-16 DIAGNOSIS — Z23 Encounter for immunization: Secondary | ICD-10-CM | POA: Diagnosis not present

## 2019-03-10 ENCOUNTER — Encounter: Payer: Self-pay | Admitting: Family Medicine

## 2019-03-10 ENCOUNTER — Other Ambulatory Visit: Payer: Self-pay

## 2019-03-10 ENCOUNTER — Telehealth: Payer: Self-pay | Admitting: Family Medicine

## 2019-03-10 ENCOUNTER — Ambulatory Visit (INDEPENDENT_AMBULATORY_CARE_PROVIDER_SITE_OTHER): Payer: Medicare HMO | Admitting: Family Medicine

## 2019-03-10 VITALS — BP 139/78 | HR 62 | Temp 98.0°F | Resp 18 | Ht 67.0 in | Wt 196.1 lb

## 2019-03-10 DIAGNOSIS — M1990 Unspecified osteoarthritis, unspecified site: Secondary | ICD-10-CM | POA: Diagnosis not present

## 2019-03-10 DIAGNOSIS — M4126 Other idiopathic scoliosis, lumbar region: Secondary | ICD-10-CM | POA: Diagnosis not present

## 2019-03-10 DIAGNOSIS — J439 Emphysema, unspecified: Secondary | ICD-10-CM | POA: Diagnosis not present

## 2019-03-10 DIAGNOSIS — I10 Essential (primary) hypertension: Secondary | ICD-10-CM | POA: Diagnosis not present

## 2019-03-10 DIAGNOSIS — I7300 Raynaud's syndrome without gangrene: Secondary | ICD-10-CM | POA: Diagnosis not present

## 2019-03-10 DIAGNOSIS — Z0000 Encounter for general adult medical examination without abnormal findings: Secondary | ICD-10-CM

## 2019-03-10 DIAGNOSIS — E669 Obesity, unspecified: Secondary | ICD-10-CM | POA: Insufficient documentation

## 2019-03-10 DIAGNOSIS — E559 Vitamin D deficiency, unspecified: Secondary | ICD-10-CM

## 2019-03-10 DIAGNOSIS — Z131 Encounter for screening for diabetes mellitus: Secondary | ICD-10-CM | POA: Diagnosis not present

## 2019-03-10 DIAGNOSIS — E7800 Pure hypercholesterolemia, unspecified: Secondary | ICD-10-CM

## 2019-03-10 DIAGNOSIS — M349 Systemic sclerosis, unspecified: Secondary | ICD-10-CM

## 2019-03-10 DIAGNOSIS — E782 Mixed hyperlipidemia: Secondary | ICD-10-CM

## 2019-03-10 LAB — LIPID PANEL
Cholesterol: 236 mg/dL — ABNORMAL HIGH (ref 0–200)
HDL: 65.6 mg/dL (ref >39.00)
LDL Cholesterol: 149 mg/dL — ABNORMAL HIGH (ref 0–99)
NonHDL: 170.34
Total CHOL/HDL Ratio: 4
Triglycerides: 108 mg/dL (ref 0.0–149.0)
VLDL: 21.6 mg/dL (ref 0.0–40.0)

## 2019-03-10 LAB — COMPREHENSIVE METABOLIC PANEL
ALT: 29 U/L (ref 0–35)
AST: 24 U/L (ref 0–37)
Albumin: 4.4 g/dL (ref 3.5–5.2)
Alkaline Phosphatase: 102 U/L (ref 39–117)
BUN: 21 mg/dL (ref 6–23)
CO2: 29 mEq/L (ref 19–32)
Calcium: 9.6 mg/dL (ref 8.4–10.5)
Chloride: 101 mEq/L (ref 96–112)
Creatinine, Ser: 1.01 mg/dL (ref 0.40–1.20)
GFR: 55.64 mL/min — ABNORMAL LOW (ref >60.00)
Glucose, Bld: 86 mg/dL (ref 70–99)
Potassium: 4.3 mEq/L (ref 3.5–5.1)
Sodium: 138 mEq/L (ref 135–145)
Total Bilirubin: 0.4 mg/dL (ref 0.2–1.2)
Total Protein: 7.3 g/dL (ref 6.0–8.3)

## 2019-03-10 LAB — TSH: TSH: 1.63 u[IU]/mL (ref 0.35–4.50)

## 2019-03-10 LAB — HEMOGLOBIN A1C: Hgb A1c MFr Bld: 5.6 % (ref 4.6–6.5)

## 2019-03-10 LAB — CBC
HCT: 37.7 % (ref 36.0–46.0)
Hemoglobin: 12.6 g/dL (ref 12.0–15.0)
MCHC: 33.4 g/dL (ref 30.0–36.0)
MCV: 90 fl (ref 78.0–100.0)
Platelets: 308 10*3/uL (ref 150.0–400.0)
RBC: 4.19 Mil/uL (ref 3.87–5.11)
RDW: 12.9 % (ref 11.5–15.5)
WBC: 5.3 10*3/uL (ref 4.0–10.5)

## 2019-03-10 LAB — VITAMIN D 25 HYDROXY (VIT D DEFICIENCY, FRACTURES): VITD: 38.64 ng/mL (ref 30.00–100.00)

## 2019-03-10 MED ORDER — ATORVASTATIN CALCIUM 20 MG PO TABS: 20.0000 mg | ORAL_TABLET | Freq: Every day | ORAL | 3 refills | Status: DC

## 2019-03-10 MED ORDER — DICLOFENAC SODIUM ER 100 MG PO TB24: 100.0000 mg | ORAL_TABLET | Freq: Every day | ORAL | 3 refills | Status: DC

## 2019-03-10 MED ORDER — HYDROCHLOROTHIAZIDE 25 MG PO TABS: 25.0000 mg | ORAL_TABLET | Freq: Every day | ORAL | 1 refills | Status: DC
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
## 2019-03-10 NOTE — Patient Instructions (Signed)
Health Maintenance, Female Adopting a healthy lifestyle and getting preventive care are important in promoting health and wellness. Ask your health care provider about:  The right schedule for you to have regular tests and exams.  Things you can do on your own to prevent diseases and keep yourself healthy. What should I know about diet, weight, and exercise? Eat a healthy diet   Eat a diet that includes plenty of vegetables, fruits, low-fat dairy products, and lean protein.  Do not eat a lot of foods that are high in solid fats, added sugars, or sodium. Maintain a healthy weight Body mass index (BMI) is used to identify weight problems. It estimates body fat based on height and weight. Your health care provider can help determine your BMI and help you achieve or maintain a healthy weight. Get regular exercise Get regular exercise. This is one of the most important things you can do for your health. Most adults should:  Exercise for at least 150 minutes each week. The exercise should increase your heart rate and make you sweat (moderate-intensity exercise).  Do strengthening exercises at least twice a week. This is in addition to the moderate-intensity exercise.  Spend less time sitting. Even light physical activity can be beneficial. Watch cholesterol and blood lipids Have your blood tested for lipids and cholesterol at 61 years of age, then have this test every 5 years. Have your cholesterol levels checked more often if:  Your lipid or cholesterol levels are high.  You are older than 61 years of age.  You are at high risk for heart disease. What should I know about cancer screening? Depending on your health history and family history, you may need to have cancer screening at various ages. This may include screening for:  Breast cancer.  Cervical cancer.  Colorectal cancer.  Skin cancer.  Lung cancer. What should I know about heart disease, diabetes, and high blood  pressure? Blood pressure and heart disease  High blood pressure causes heart disease and increases the risk of stroke. This is more likely to develop in people who have high blood pressure readings, are of African descent, or are overweight.  Have your blood pressure checked: ? Every 3-5 years if you are 18-39 years of age. ? Every year if you are 40 years old or older. Diabetes Have regular diabetes screenings. This checks your fasting blood sugar level. Have the screening done:  Once every three years after age 40 if you are at a normal weight and have a low risk for diabetes.  More often and at a younger age if you are overweight or have a high risk for diabetes. What should I know about preventing infection? Hepatitis B If you have a higher risk for hepatitis B, you should be screened for this virus. Talk with your health care provider to find out if you are at risk for hepatitis B infection. Hepatitis C Testing is recommended for:  Everyone born from 1945 through 1965.  Anyone with known risk factors for hepatitis C. Sexually transmitted infections (STIs)  Get screened for STIs, including gonorrhea and chlamydia, if: ? You are sexually active and are younger than 61 years of age. ? You are older than 61 years of age and your health care provider tells you that you are at risk for this type of infection. ? Your sexual activity has changed since you were last screened, and you are at increased risk for chlamydia or gonorrhea. Ask your health care provider if   you are at risk.  Ask your health care provider about whether you are at high risk for HIV. Your health care provider may recommend a prescription medicine to help prevent HIV infection. If you choose to take medicine to prevent HIV, you should first get tested for HIV. You should then be tested every 3 months for as long as you are taking the medicine. Pregnancy  If you are about to stop having your period (premenopausal) and  you may become pregnant, seek counseling before you get pregnant.  Take 400 to 800 micrograms (mcg) of folic acid every day if you become pregnant.  Ask for birth control (contraception) if you want to prevent pregnancy. Osteoporosis and menopause Osteoporosis is a disease in which the bones lose minerals and strength with aging. This can result in bone fractures. If you are 65 years old or older, or if you are at risk for osteoporosis and fractures, ask your health care provider if you should:  Be screened for bone loss.  Take a calcium or vitamin D supplement to lower your risk of fractures.  Be given hormone replacement therapy (HRT) to treat symptoms of menopause. Follow these instructions at home: Lifestyle  Do not use any products that contain nicotine or tobacco, such as cigarettes, e-cigarettes, and chewing tobacco. If you need help quitting, ask your health care provider.  Do not use street drugs.  Do not share needles.  Ask your health care provider for help if you need support or information about quitting drugs. Alcohol use  Do not drink alcohol if: ? Your health care provider tells you not to drink. ? You are pregnant, may be pregnant, or are planning to become pregnant.  If you drink alcohol: ? Limit how much you use to 0-1 drink a day. ? Limit intake if you are breastfeeding.  Be aware of how much alcohol is in your drink. In the [place] one drink equals one 12 oz bottle of beer (355 mL), one 5 oz glass of wine (148 mL), or one 1 oz glass of hard liquor (44 mL). General instructions  Schedule regular health, dental, and eye exams.  Stay current with your vaccines.  Tell your health care provider if: ? You often feel depressed. ? You have ever been abused or do not feel safe at home. Summary  Adopting a healthy lifestyle and getting preventive care are important in promoting health and wellness.  Follow your health care provider's instructions about healthy  diet, exercising, and getting tested or screened for diseases.  Follow your health care provider's instructions on monitoring your cholesterol and blood pressure. This information is not intended to replace advice given to you by your health care provider. Make sure you discuss any questions you have with your health care provider. Document Released: 10/24/2010 Document Revised: 04/03/2018 Document Reviewed: 04/03/2018 Elsevier Patient Education  2020 Elsevier Inc.  

## 2019-03-10 NOTE — Telephone Encounter (Signed)
Please inform patient the following information: Her labs all look good with the exception of her cholesterol/LDL is still elevated. I would suggest she restart the atorvastatin- I did call in refills.

## 2019-03-11 NOTE — Telephone Encounter (Signed)
Pt was called and given information, she verbalized understanding  

## 2019-07-30 ENCOUNTER — Encounter: Payer: Self-pay | Admitting: Family Medicine

## 2019-08-26 DIAGNOSIS — M5136 Other intervertebral disc degeneration, lumbar region: Secondary | ICD-10-CM | POA: Diagnosis not present

## 2019-08-26 DIAGNOSIS — M419 Scoliosis, unspecified: Secondary | ICD-10-CM | POA: Diagnosis not present

## 2019-08-26 DIAGNOSIS — M170 Bilateral primary osteoarthritis of knee: Secondary | ICD-10-CM | POA: Diagnosis not present

## 2019-08-29 ENCOUNTER — Other Ambulatory Visit: Payer: Self-pay

## 2019-08-29 DIAGNOSIS — J0100 Acute maxillary sinusitis, unspecified: Secondary | ICD-10-CM

## 2019-08-29 MED ORDER — FLUTICASONE PROPIONATE 50 MCG/ACT NA SUSP: 2.0000 | Freq: Every day | NASAL | 6 refills | Status: DC

## 2019-08-29 MED ORDER — ALBUTEROL SULFATE HFA 108 (90 BASE) MCG/ACT IN AERS: 2.0000 | INHALATION_SPRAY | Freq: Four times a day (QID) | RESPIRATORY_TRACT | 0 refills | Status: DC | PRN

## 2019-08-29 NOTE — Telephone Encounter (Signed)
Lipitor- has refills  Diclofenac- Has refills HCTZ- Has refills  Will call pt to tell her the others can be transferred   Zinc 03/10/2019 NOV 09/04/2019 Last written: Albuterol 01/28/18 1 inhaler 0 refills Flonase 07/15/18 16g 6 refills  Please advise on albuterol and flonase

## 2019-08-29 NOTE — Telephone Encounter (Signed)
Patient refill meds request. Scheduled appt for 5/13 [facility] refill meds. Patient has also change pharmacies.   albuterol (PROVENTIL HFA;VENTOLIN HFA) 108 (90 Base) MCG/ACT inhaler [id number]   atorvastatin (LIPITOR) 20 MG tablet [id number]   Diclofenac Sodium CR 100 MG 24 hr tablet [id number]   hydrochlorothiazide (HYDRODIURIL) 25 MG tablet [id number]    Please change preferred pharmacy to  [place], [place]

## 2019-08-29 NOTE — Telephone Encounter (Signed)
Pt was called and given information to have RX's transferred and Albuterol and flonase sent to wrong pharmacy. Cancelled at [facility] and resent to [facility]. Pt verbalized understanding

## 2019-08-29 NOTE — Addendum Note (Signed)
Addended by: Caroll Rancher L on: 08/29/2019 04:26 PM   Modules accepted: Orders

## 2019-09-04 ENCOUNTER — Other Ambulatory Visit: Payer: Self-pay

## 2019-09-04 ENCOUNTER — Ambulatory Visit (INDEPENDENT_AMBULATORY_CARE_PROVIDER_SITE_OTHER): Payer: Medicare HMO | Admitting: Family Medicine

## 2019-09-04 ENCOUNTER — Encounter: Payer: Self-pay | Admitting: Family Medicine

## 2019-09-04 VITALS — BP 134/94 | HR 70 | Temp 97.5°F | Resp 17 | Ht 67.0 in | Wt 199.0 lb

## 2019-09-04 DIAGNOSIS — J0100 Acute maxillary sinusitis, unspecified: Secondary | ICD-10-CM | POA: Diagnosis not present

## 2019-09-04 DIAGNOSIS — J439 Emphysema, unspecified: Secondary | ICD-10-CM | POA: Diagnosis not present

## 2019-09-04 DIAGNOSIS — M1990 Unspecified osteoarthritis, unspecified site: Secondary | ICD-10-CM | POA: Diagnosis not present

## 2019-09-04 DIAGNOSIS — M349 Systemic sclerosis, unspecified: Secondary | ICD-10-CM

## 2019-09-04 DIAGNOSIS — E669 Obesity, unspecified: Secondary | ICD-10-CM

## 2019-09-04 DIAGNOSIS — E7800 Pure hypercholesterolemia, unspecified: Secondary | ICD-10-CM | POA: Diagnosis not present

## 2019-09-04 DIAGNOSIS — I10 Essential (primary) hypertension: Secondary | ICD-10-CM | POA: Diagnosis not present

## 2019-09-04 MED ORDER — ALBUTEROL SULFATE HFA 108 (90 BASE) MCG/ACT IN AERS: 2.0000 | INHALATION_SPRAY | Freq: Four times a day (QID) | RESPIRATORY_TRACT | 0 refills | Status: DC | PRN

## 2019-09-04 MED ORDER — HYDROCHLOROTHIAZIDE 25 MG PO TABS: 25.0000 mg | ORAL_TABLET | Freq: Every day | ORAL | 1 refills | Status: DC

## 2019-09-04 NOTE — Patient Instructions (Addendum)
It was very nice to see you today! Have a great summer.   Please schedule your physical for 11/17 or a few days after.   Come fasting to that appt. Take your BP meds everyday- even on appt days. If fasting required you can still take meds with water.    I have refilled your meds.    Leg Cramps Leg cramps occur when one or more muscles tighten and you have no control over this tightening (involuntary muscle contraction). Muscle cramps can develop in any muscle, but the most common place is in the calf muscles of the leg. Those cramps can occur during exercise or when you are at rest. Leg cramps are painful, and they may last for a few seconds to a few minutes. Cramps may return several times before they finally stop. Usually, leg cramps are not caused by a serious medical problem. In many cases, the cause is not known. Some common causes include:  Excessive physical effort (overexertion), such as during intense exercise.  Overuse from repetitive motions, or doing the same thing over and over.  Staying in a certain position for a long period of time.  Improper preparation, form, or technique while performing a sport or an activity.  Dehydration.  Injury.  Side effects of certain medicines.  Abnormally low levels of minerals in your blood (electrolytes), especially potassium and calcium. This could result from: ? Pregnancy. ? Taking diuretic medicines. Follow these instructions at home: Eating and drinking  Drink enough fluid to keep your urine pale yellow. Staying hydrated may help prevent cramps.  Eat a healthy diet that includes plenty of nutrients to help your muscles function. A healthy diet includes fruits and vegetables, lean protein, whole grains, and low-fat or nonfat dairy products. Managing pain, stiffness, and swelling      Try massaging, stretching, and relaxing the affected muscle. Do this for several minutes at a time.  If directed, put ice on areas that are  sore or painful after a cramp: ? Put ice in a plastic bag. ? Place a towel between your skin and the bag. ? Leave the ice on for 20 minutes, 2-3 times a day.  If directed, apply heat to muscles that are tense or tight. Do this before you exercise, or as often as told by your health care provider. Use the heat source that your health care provider recommends, such as a moist heat pack or a heating pad. ? Place a towel between your skin and the heat source. ? Leave the heat on for 20-30 minutes. ? Remove the heat if your skin turns bright red. This is especially important if you are unable to feel pain, heat, or cold. You may have a greater risk of getting burned.  Try taking hot showers or baths to help relax tight muscles. General instructions  If you are having frequent leg cramps, avoid intense exercise for several days.  Take over-the-counter and prescription medicines only as told by your health care provider.  Keep all follow-up visits as told by your health care provider. This is important. Contact a health care provider if:  Your leg cramps get more severe or more frequent, or they do not improve over time.  Your foot becomes cold, numb, or blue. Summary  Muscle cramps can develop in any muscle, but the most common place is in the calf muscles of the leg.  Leg cramps are painful, and they may last for a few seconds to a few minutes.  Usually, leg cramps are not caused by a serious medical problem. Often, the cause is not known.  Stay hydrated and take over-the-counter and prescription medicines only as told by your health care provider. This information is not intended to replace advice given to you by your health care provider. Make sure you discuss any questions you have with your health care provider. Document Revised: 03/23/2017 Document Reviewed: 01/18/2017 Elsevier Patient Education  2020 [employer].
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
## 2019-10-02 ENCOUNTER — Ambulatory Visit: Payer: Medicare HMO | Admitting: Family Medicine

## 2019-10-13 ENCOUNTER — Other Ambulatory Visit: Payer: Self-pay | Admitting: Family Medicine

## 2019-10-13 ENCOUNTER — Other Ambulatory Visit (HOSPITAL_BASED_OUTPATIENT_CLINIC_OR_DEPARTMENT_OTHER): Payer: Self-pay | Admitting: Family Medicine

## 2019-10-13 ENCOUNTER — Other Ambulatory Visit (HOSPITAL_BASED_OUTPATIENT_CLINIC_OR_DEPARTMENT_OTHER): Payer: Self-pay | Admitting: Physician Assistant

## 2019-10-13 DIAGNOSIS — Z1231 Encounter for screening mammogram for malignant neoplasm of breast: Secondary | ICD-10-CM

## 2019-10-15 ENCOUNTER — Other Ambulatory Visit: Payer: Self-pay

## 2019-10-15 ENCOUNTER — Ambulatory Visit (INDEPENDENT_AMBULATORY_CARE_PROVIDER_SITE_OTHER): Payer: Medicare HMO

## 2019-10-15 DIAGNOSIS — Z1231 Encounter for screening mammogram for malignant neoplasm of breast: Secondary | ICD-10-CM

## 2019-11-12 DIAGNOSIS — M25561 Pain in right knee: Secondary | ICD-10-CM | POA: Diagnosis not present

## 2019-11-12 DIAGNOSIS — M25562 Pain in left knee: Secondary | ICD-10-CM | POA: Diagnosis not present

## 2019-11-24 DIAGNOSIS — M1712 Unilateral primary osteoarthritis, left knee: Secondary | ICD-10-CM | POA: Diagnosis not present

## 2019-11-24 DIAGNOSIS — M25762 Osteophyte, left knee: Secondary | ICD-10-CM | POA: Diagnosis not present

## 2019-11-24 DIAGNOSIS — M25569 Pain in unspecified knee: Secondary | ICD-10-CM | POA: Diagnosis not present

## 2019-11-24 DIAGNOSIS — M948X6 Other specified disorders of cartilage, lower leg: Secondary | ICD-10-CM | POA: Diagnosis not present

## 2019-11-24 DIAGNOSIS — M23322 Other meniscus derangements, posterior horn of medial meniscus, left knee: Secondary | ICD-10-CM | POA: Diagnosis not present

## 2019-12-15 DIAGNOSIS — M1712 Unilateral primary osteoarthritis, left knee: Secondary | ICD-10-CM | POA: Diagnosis not present

## 2019-12-15 DIAGNOSIS — M1711 Unilateral primary osteoarthritis, right knee: Secondary | ICD-10-CM | POA: Diagnosis not present

## 2020-02-23 ENCOUNTER — Other Ambulatory Visit: Payer: Self-pay | Admitting: Family Medicine

## 2020-02-23 DIAGNOSIS — I10 Essential (primary) hypertension: Secondary | ICD-10-CM

## 2020-03-11 ENCOUNTER — Ambulatory Visit (INDEPENDENT_AMBULATORY_CARE_PROVIDER_SITE_OTHER): Payer: Medicare HMO | Admitting: Family Medicine

## 2020-03-11 ENCOUNTER — Encounter: Payer: Self-pay | Admitting: Family Medicine

## 2020-03-11 ENCOUNTER — Other Ambulatory Visit: Payer: Self-pay

## 2020-03-11 VITALS — BP 131/79 | HR 82 | Temp 98.1°F | Ht 64.5 in | Wt 197.0 lb

## 2020-03-11 DIAGNOSIS — E669 Obesity, unspecified: Secondary | ICD-10-CM | POA: Diagnosis not present

## 2020-03-11 DIAGNOSIS — E7800 Pure hypercholesterolemia, unspecified: Secondary | ICD-10-CM | POA: Diagnosis not present

## 2020-03-11 DIAGNOSIS — I10 Essential (primary) hypertension: Secondary | ICD-10-CM | POA: Diagnosis not present

## 2020-03-11 DIAGNOSIS — Z1231 Encounter for screening mammogram for malignant neoplasm of breast: Secondary | ICD-10-CM

## 2020-03-11 DIAGNOSIS — E559 Vitamin D deficiency, unspecified: Secondary | ICD-10-CM

## 2020-03-11 DIAGNOSIS — I7300 Raynaud's syndrome without gangrene: Secondary | ICD-10-CM

## 2020-03-11 DIAGNOSIS — E782 Mixed hyperlipidemia: Secondary | ICD-10-CM

## 2020-03-11 DIAGNOSIS — Z0000 Encounter for general adult medical examination without abnormal findings: Secondary | ICD-10-CM

## 2020-03-11 DIAGNOSIS — M4126 Other idiopathic scoliosis, lumbar region: Secondary | ICD-10-CM

## 2020-03-11 DIAGNOSIS — M1990 Unspecified osteoarthritis, unspecified site: Secondary | ICD-10-CM

## 2020-03-11 DIAGNOSIS — Z131 Encounter for screening for diabetes mellitus: Secondary | ICD-10-CM | POA: Diagnosis not present

## 2020-03-11 DIAGNOSIS — Z79899 Other long term (current) drug therapy: Secondary | ICD-10-CM

## 2020-03-11 LAB — CBC WITH DIFFERENTIAL/PLATELET
Basophils Absolute: 0.1 10*3/uL (ref 0.0–0.1)
Basophils Relative: 1.3 % (ref 0.0–3.0)
Eosinophils Absolute: 0.1 10*3/uL (ref 0.0–0.7)
Eosinophils Relative: 2.1 % (ref 0.0–5.0)
HCT: 41.2 % (ref 36.0–46.0)
Hemoglobin: 13.9 g/dL (ref 12.0–15.0)
Lymphocytes Relative: 28.9 % (ref 12.0–46.0)
Lymphs Abs: 1.6 10*3/uL (ref 0.7–4.0)
MCHC: 33.6 g/dL (ref 30.0–36.0)
MCV: 89.9 fl (ref 78.0–100.0)
Monocytes Absolute: 0.5 10*3/uL (ref 0.1–1.0)
Monocytes Relative: 9.4 % (ref 3.0–12.0)
Neutro Abs: 3.3 10*3/uL (ref 1.4–7.7)
Neutrophils Relative %: 58.3 % (ref 43.0–77.0)
Platelets: 350 10*3/uL (ref 150.0–400.0)
RBC: 4.59 Mil/uL (ref 3.87–5.11)
RDW: 12.7 % (ref 11.5–15.5)
WBC: 5.6 10*3/uL (ref 4.0–10.5)

## 2020-03-11 LAB — LIPID PANEL
Cholesterol: 165 mg/dL (ref 0–200)
HDL: 59.4 mg/dL (ref >39.00)
LDL Cholesterol: 77 mg/dL (ref 0–99)
NonHDL: 105.36
Total CHOL/HDL Ratio: 3
Triglycerides: 141 mg/dL (ref 0.0–149.0)
VLDL: 28.2 mg/dL (ref 0.0–40.0)

## 2020-03-11 LAB — COMPREHENSIVE METABOLIC PANEL
ALT: 23 U/L (ref 0–35)
AST: 31 U/L (ref 0–37)
Albumin: 4.6 g/dL (ref 3.5–5.2)
Alkaline Phosphatase: 107 U/L (ref 39–117)
BUN: 20 mg/dL (ref 6–23)
CO2: 30 mEq/L (ref 19–32)
Calcium: 9.9 mg/dL (ref 8.4–10.5)
Chloride: 100 mEq/L (ref 96–112)
Creatinine, Ser: 0.76 mg/dL (ref 0.40–1.20)
GFR: 83.97 mL/min (ref >60.00)
Glucose, Bld: 104 mg/dL — ABNORMAL HIGH (ref 70–99)
Potassium: 5.7 mEq/L — ABNORMAL HIGH (ref 3.5–5.1)
Sodium: 137 mEq/L (ref 135–145)
Total Bilirubin: 0.5 mg/dL (ref 0.2–1.2)
Total Protein: 7.9 g/dL (ref 6.0–8.3)

## 2020-03-11 LAB — VITAMIN D 25 HYDROXY (VIT D DEFICIENCY, FRACTURES): VITD: 39.84 ng/mL (ref 30.00–100.00)

## 2020-03-11 LAB — TSH: TSH: 1.32 u[IU]/mL (ref 0.35–4.50)

## 2020-03-11 LAB — HEMOGLOBIN A1C: Hgb A1c MFr Bld: 5.8 % (ref 4.6–6.5)

## 2020-03-11 MED ORDER — FLUTICASONE PROPIONATE 50 MCG/ACT NA SUSP
2.0000 | Freq: Every day | NASAL | 6 refills | Status: DC
Start: 2020-03-11 — End: 2021-04-05

## 2020-03-11 MED ORDER — DICLOFENAC SODIUM ER 100 MG PO TB24: 100.0000 mg | ORAL_TABLET | Freq: Every day | ORAL | 1 refills | Status: DC

## 2020-03-11 MED ORDER — FLUTICASONE PROPIONATE 50 MCG/ACT NA SUSP
2.0000 | Freq: Every day | NASAL | 6 refills | Status: DC
Start: 2020-03-11 — End: 2020-03-11

## 2020-03-11 MED ORDER — ATORVASTATIN CALCIUM 20 MG PO TABS: 20.0000 mg | ORAL_TABLET | Freq: Every day | ORAL | 3 refills | Status: DC

## 2020-03-11 MED ORDER — HYDROCHLOROTHIAZIDE 25 MG PO TABS: 25.0000 mg | ORAL_TABLET | Freq: Every day | ORAL | 1 refills | Status: DC
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
## 2020-03-11 NOTE — Patient Instructions (Signed)
Health Maintenance, Female Adopting a healthy lifestyle and getting preventive care are important in promoting health and wellness. Ask your health care provider about:  The right schedule for you to have regular tests and exams.  Things you can do on your own to prevent diseases and keep yourself healthy. What should I know about diet, weight, and exercise? Eat a healthy diet   Eat a diet that includes plenty of vegetables, fruits, low-fat dairy products, and lean protein.  Do not eat a lot of foods that are high in solid fats, added sugars, or sodium. Maintain a healthy weight Body mass index (BMI) is used to identify weight problems. It estimates body fat based on height and weight. Your health care provider can help determine your BMI and help you achieve or maintain a healthy weight. Get regular exercise Get regular exercise. This is one of the most important things you can do for your health. Most adults should:  Exercise for at least 150 minutes each week. The exercise should increase your heart rate and make you sweat (moderate-intensity exercise).  Do strengthening exercises at least twice a week. This is in addition to the moderate-intensity exercise.  Spend less time sitting. Even light physical activity can be beneficial. Watch cholesterol and blood lipids Have your blood tested for lipids and cholesterol at 62 years of age, then have this test every 5 years. Have your cholesterol levels checked more often if:  Your lipid or cholesterol levels are high.  You are older than 62 years of age.  You are at high risk for heart disease. What should I know about cancer screening? Depending on your health history and family history, you may need to have cancer screening at various ages. This may include screening for:  Breast cancer.  Cervical cancer.  Colorectal cancer.  Skin cancer.  Lung cancer. What should I know about heart disease, diabetes, and high blood  pressure? Blood pressure and heart disease  High blood pressure causes heart disease and increases the risk of stroke. This is more likely to develop in people who have high blood pressure readings, are of African descent, or are overweight.  Have your blood pressure checked: ? Every 3-5 years if you are 18-39 years of age. ? Every year if you are 40 years old or older. Diabetes Have regular diabetes screenings. This checks your fasting blood sugar level. Have the screening done:  Once every three years after age 40 if you are at a normal weight and have a low risk for diabetes.  More often and at a younger age if you are overweight or have a high risk for diabetes. What should I know about preventing infection? Hepatitis B If you have a higher risk for hepatitis B, you should be screened for this virus. Talk with your health care provider to find out if you are at risk for hepatitis B infection. Hepatitis C Testing is recommended for:  Everyone born from 1945 through 1965.  Anyone with known risk factors for hepatitis C. Sexually transmitted infections (STIs)  Get screened for STIs, including gonorrhea and chlamydia, if: ? You are sexually active and are younger than 62 years of age. ? You are older than 62 years of age and your health care provider tells you that you are at risk for this type of infection. ? Your sexual activity has changed since you were last screened, and you are at increased risk for chlamydia or gonorrhea. Ask your health care provider if   you are at risk.  Ask your health care provider about whether you are at high risk for HIV. Your health care provider may recommend a prescription medicine to help prevent HIV infection. If you choose to take medicine to prevent HIV, you should first get tested for HIV. You should then be tested every 3 months for as long as you are taking the medicine. Pregnancy  If you are about to stop having your period (premenopausal) and  you may become pregnant, seek counseling before you get pregnant.  Take 400 to 800 micrograms (mcg) of folic acid every day if you become pregnant.  Ask for birth control (contraception) if you want to prevent pregnancy. Osteoporosis and menopause Osteoporosis is a disease in which the bones lose minerals and strength with aging. This can result in bone fractures. If you are 65 years old or older, or if you are at risk for osteoporosis and fractures, ask your health care provider if you should:  Be screened for bone loss.  Take a calcium or vitamin D supplement to lower your risk of fractures.  Be given hormone replacement therapy (HRT) to treat symptoms of menopause. Follow these instructions at home: Lifestyle  Do not use any products that contain nicotine or tobacco, such as cigarettes, e-cigarettes, and chewing tobacco. If you need help quitting, ask your health care provider.  Do not use street drugs.  Do not share needles.  Ask your health care provider for help if you need support or information about quitting drugs. Alcohol use  Do not drink alcohol if: ? Your health care provider tells you not to drink. ? You are pregnant, may be pregnant, or are planning to become pregnant.  If you drink alcohol: ? Limit how much you use to 0-1 drink a day. ? Limit intake if you are breastfeeding.  Be aware of how much alcohol is in your drink. In the [place] one drink equals one 12 oz bottle of beer (355 mL), one 5 oz glass of wine (148 mL), or one 1 oz glass of hard liquor (44 mL). General instructions  Schedule regular health, dental, and eye exams.  Stay current with your vaccines.  Tell your health care provider if: ? You often feel depressed. ? You have ever been abused or do not feel safe at home. Summary  Adopting a healthy lifestyle and getting preventive care are important in promoting health and wellness.  Follow your health care provider's instructions about healthy  diet, exercising, and getting tested or screened for diseases.  Follow your health care provider's instructions on monitoring your cholesterol and blood pressure. This information is not intended to replace advice given to you by your health care provider. Make sure you discuss any questions you have with your health care provider. Document Revised: 04/03/2018 Document Reviewed: 04/03/2018 Elsevier Patient Education  2020 Else[employer] Inc.  

## 2020-03-22 IMAGING — MG DIGITAL SCREENING BILATERAL MAMMOGRAM WITH TOMO AND CAD
6 of 12 series · 6 of 36 positions shown · non-contrast
Comparison: Previous exam(s).

CLINICAL DATA: Screening.

EXAM:
DIGITAL SCREENING BILATERAL MAMMOGRAM WITH TOMO AND CAD

[L CC synth-2D (1 of 2)]
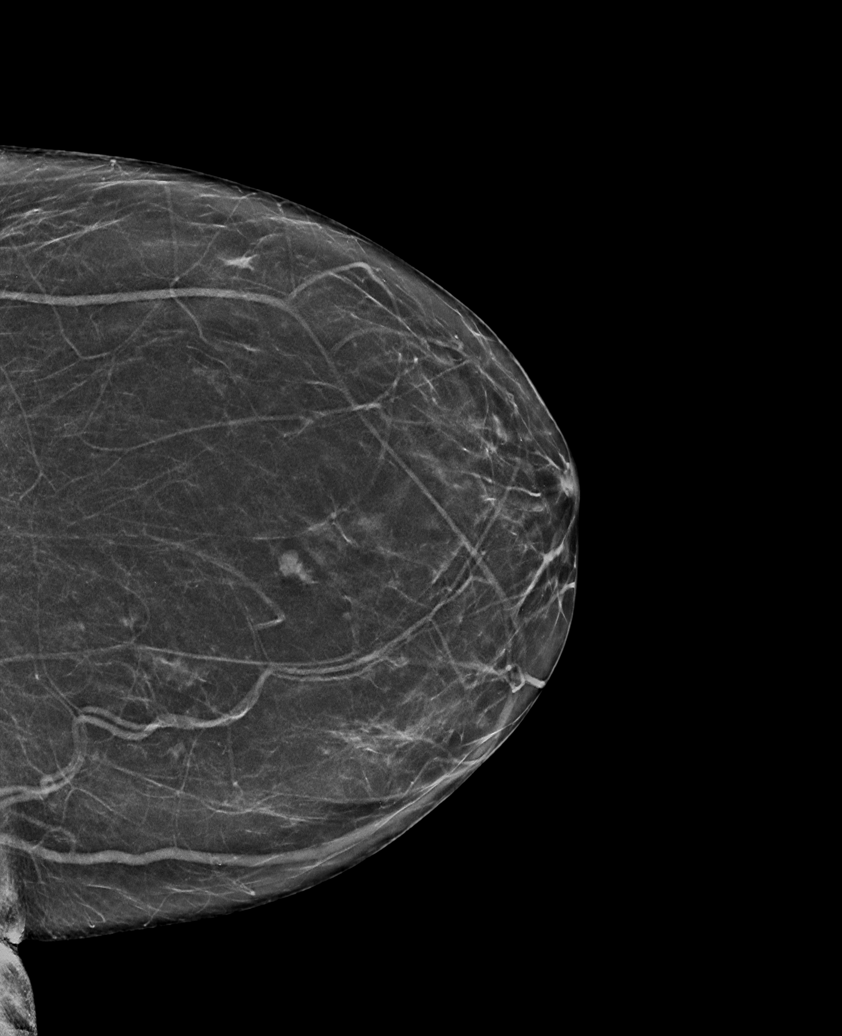

[L CC synth-2D (2 of 2)]
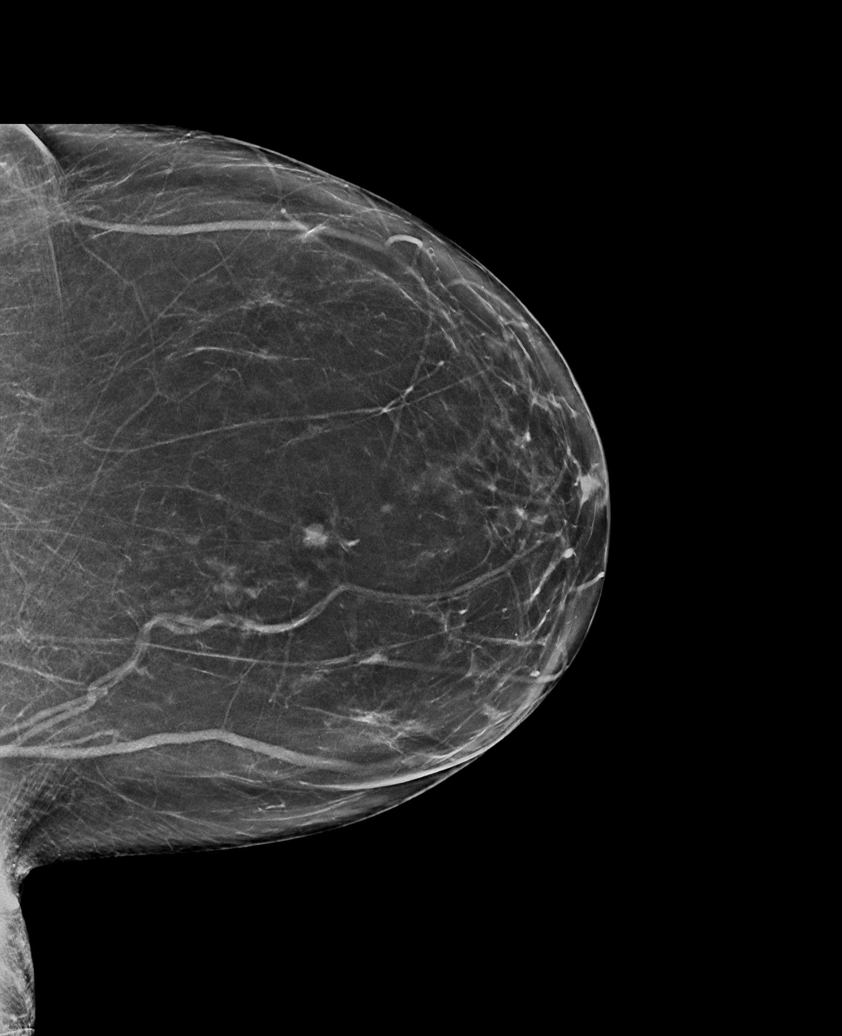

[R MLO synth-2D]
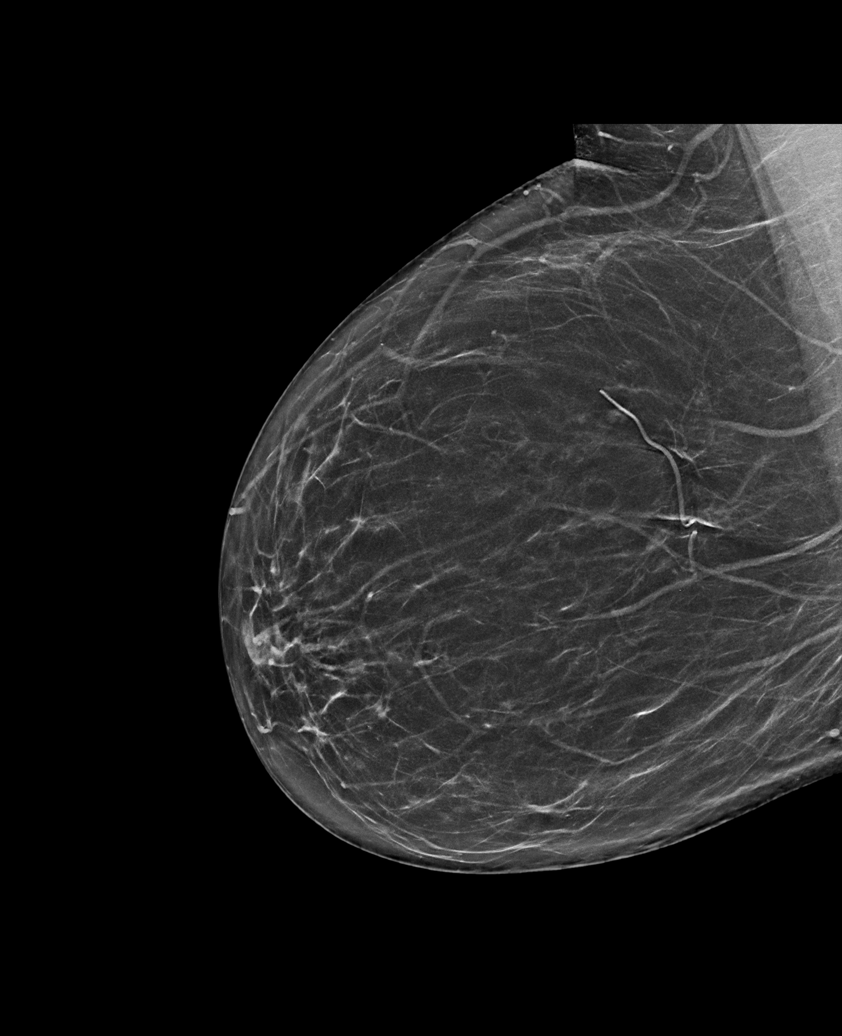

[R CC synth-2D (1 of 2)]
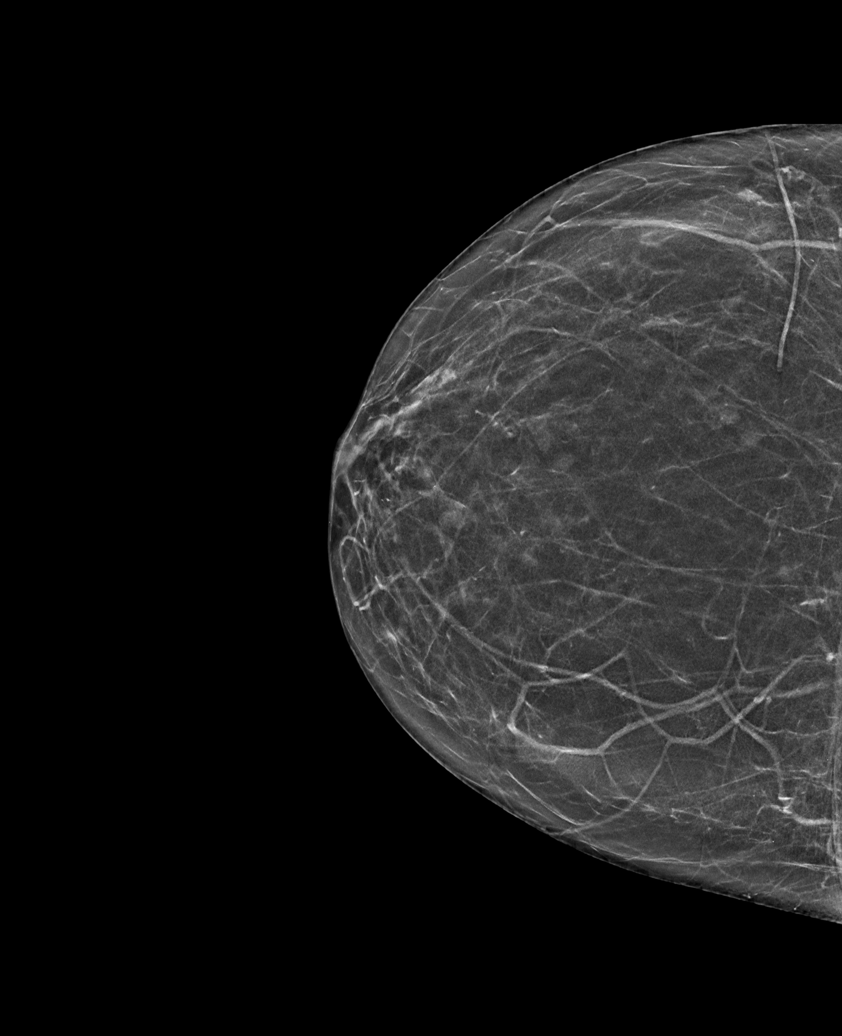

[R CC synth-2D (2 of 2)]
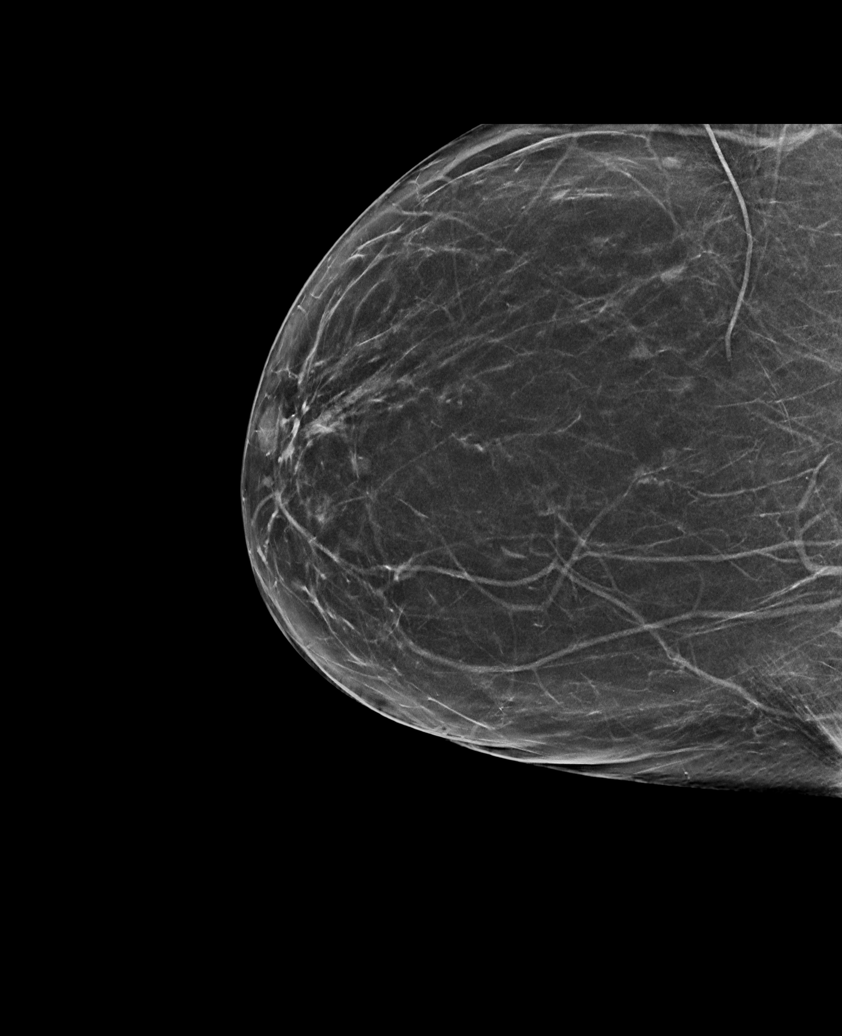

[L MLO synth-2D]
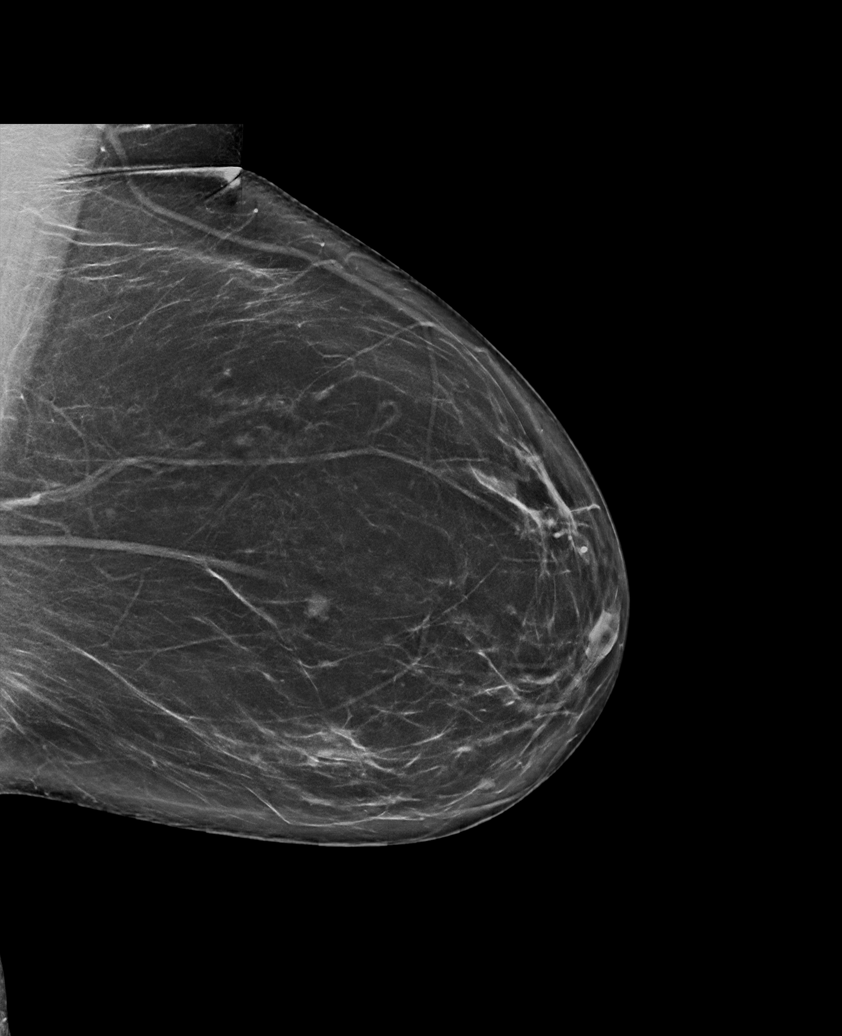

[6 of 36 positions shown; findings below may reference images not displayed]

ACR Breast Density Category b: There are scattered areas of
fibroglandular density.
FINDINGS: There are no findings suspicious for malignancy. Images were
processed with CAD.
IMPRESSION: No mammographic evidence of malignancy. A result letter of this
screening mammogram will be mailed directly to the patient.

RECOMMENDATION:
Screening mammogram in one year. (Code:[id number])

BI-RADS CATEGORY  1: Negative.

## 2020-05-24 DIAGNOSIS — I10 Essential (primary) hypertension: Secondary | ICD-10-CM | POA: Diagnosis not present

## 2020-05-24 DIAGNOSIS — E785 Hyperlipidemia, unspecified: Secondary | ICD-10-CM | POA: Diagnosis not present

## 2020-05-24 DIAGNOSIS — Z809 Family history of malignant neoplasm, unspecified: Secondary | ICD-10-CM | POA: Diagnosis not present

## 2020-05-24 DIAGNOSIS — M2550 Pain in unspecified joint: Secondary | ICD-10-CM | POA: Diagnosis not present

## 2020-05-24 DIAGNOSIS — Z791 Long term (current) use of non-steroidal anti-inflammatories (NSAID): Secondary | ICD-10-CM | POA: Diagnosis not present

## 2020-05-24 DIAGNOSIS — E669 Obesity, unspecified: Secondary | ICD-10-CM | POA: Diagnosis not present

## 2020-05-24 DIAGNOSIS — Z823 Family history of stroke: Secondary | ICD-10-CM | POA: Diagnosis not present

## 2020-05-24 DIAGNOSIS — R69 Illness, unspecified: Secondary | ICD-10-CM | POA: Diagnosis not present

## 2020-05-24 DIAGNOSIS — G8929 Other chronic pain: Secondary | ICD-10-CM | POA: Diagnosis not present

## 2020-05-24 DIAGNOSIS — Z6831 Body mass index (BMI) 31.0-31.9, adult: Secondary | ICD-10-CM | POA: Diagnosis not present

## 2020-06-15 DIAGNOSIS — M170 Bilateral primary osteoarthritis of knee: Secondary | ICD-10-CM | POA: Diagnosis not present

## 2020-06-30 ENCOUNTER — Ambulatory Visit (INDEPENDENT_AMBULATORY_CARE_PROVIDER_SITE_OTHER): Payer: Medicare HMO

## 2020-06-30 VITALS — Ht 64.5 in | Wt 194.7 lb

## 2020-06-30 DIAGNOSIS — Z0000 Encounter for general adult medical examination without abnormal findings: Secondary | ICD-10-CM
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
## 2020-06-30 NOTE — Patient Instructions (Signed)
Brittney Myers , Thank you for taking time to come for your Medicare Wellness Visit. I appreciate your ongoing commitment to your health goals. Please review the following plan we discussed and let me know if I can assist you in the future.   Screening recommendations/referrals: Colonoscopy: Completed 01/01/2018-Due 01/02/2023 Mammogram: Completed 10/15/2019-Due 10/14/2020 Bone Density:Completed 01/28/2016-Discuss with PCP to see when to repeat. Recommended yearly ophthalmology/optometry visit for glaucoma screening and checkup Recommended yearly dental visit for hygiene and checkup  Vaccinations: Influenza vaccine: Up to date Pneumococcal vaccine:Up to date Tdap vaccine: Discuss with pharmacy Shingles vaccine: Completed vaccines Covid-19:Completed vaccines  Advanced directives: Please bring a copy for your chart  Conditions/risks identified: See problem list  Next appointment: Follow up in one year for your annual wellness visit    Preventive Care 63 Years and Older, Female Preventive care refers to lifestyle choices and visits with your health care provider that can promote health and wellness. What does preventive care include?  A yearly physical exam. This is also called an annual well check.  Dental exams once or twice a year.  Routine eye exams. Ask your health care provider how often you should have your eyes checked.  Personal lifestyle choices, including:  Daily care of your teeth and gums.  Regular physical activity.  Eating a healthy diet.  Avoiding tobacco and drug use.  Limiting alcohol use.  Practicing safe sex.  Taking low-dose aspirin every day.  Taking vitamin and mineral supplements as recommended by your health care provider. What happens during an annual well check? The services and screenings done by your health care provider during your annual well check will depend on your age, overall health, lifestyle risk factors, and family history of  disease. Counseling  Your health care provider may ask you questions about your:  Alcohol use.  Tobacco use.  Drug use.  Emotional well-being.  Home and relationship well-being.  Sexual activity.  Eating habits.  History of falls.  Memory and ability to understand (cognition).  Work and work Statistician.  Reproductive health. Screening  You may have the following tests or measurements:  Height, weight, and BMI.  Blood pressure.  Lipid and cholesterol levels. These may be checked every 5 years, or more frequently if you are over 63 years old.  Skin check.  Lung cancer screening. You may have this screening every year starting at age 63 if you have a 30-pack-year history of smoking and currently smoke or have quit within the past 15 years.  Fecal occult blood test (FOBT) of the stool. You may have this test every year starting at age 63.  Flexible sigmoidoscopy or colonoscopy. You may have a sigmoidoscopy every 5 years or a colonoscopy every 10 years starting at age 63.  Hepatitis C blood test.  Hepatitis B blood test.  Sexually transmitted disease (STD) testing.  Diabetes screening. This is done by checking your blood sugar (glucose) after you have not eaten for a while (fasting). You may have this done every 1-3 years.  Bone density scan. This is done to screen for osteoporosis. You may have this done starting at age 63.  Mammogram. This may be done every 1-2 years. Talk to your health care provider about how often you should have regular mammograms. Talk with your health care provider about your test results, treatment options, and if necessary, the need for more tests. Vaccines  Your health care provider may recommend certain vaccines, such as:  Influenza vaccine. This is recommended every year.  Tetanus, diphtheria, and acellular pertussis (Tdap, Td) vaccine. You may need a Td booster every 10 years.  Zoster vaccine. You may need this after age  63.  Pneumococcal 13-valent conjugate (PCV13) vaccine. One dose is recommended after age 63.  Pneumococcal polysaccharide (PPSV23) vaccine. One dose is recommended after age 63. Talk to your health care provider about which screenings and vaccines you need and how often you need them. This information is not intended to replace advice given to you by your health care provider. Make sure you discuss any questions you have with your health care provider. Document Released: 05/07/2015 Document Revised: 12/29/2015 Document Reviewed: 02/09/2015 Elsevier Interactive Patient Education  2017 [employer] Prevention in the Home Falls can cause injuries. They can happen to people of all ages. There are many things you can do to make your home safe and to help prevent falls. What can I do on the outside of my home?  Regularly fix the edges of walkways and driveways and fix any cracks.  Remove anything that might make you trip as you walk through a door, such as a raised step or threshold.  Trim any bushes or trees on the path to your home.  Use bright outdoor lighting.  Clear any walking paths of anything that might make someone trip, such as rocks or tools.  Regularly check to see if handrails are loose or broken. Make sure that both sides of any steps have handrails.  Any raised decks and porches should have guardrails on the edges.  Have any leaves, snow, or ice cleared regularly.  Use sand or salt on walking paths during winter.  Clean up any spills in your garage right away. This includes oil or grease spills. What can I do in the bathroom?  Use night lights.  Install grab bars by the toilet and in the tub and shower. Do not use towel bars as grab bars.  Use non-skid mats or decals in the tub or shower.  If you need to sit down in the shower, use a plastic, non-slip stool.  Keep the floor dry. Clean up any water that spills on the floor as soon as it happens.  Remove  soap buildup in the tub or shower regularly.  Attach bath mats securely with double-sided non-slip rug tape.  Do not have throw rugs and other things on the floor that can make you trip. What can I do in the bedroom?  Use night lights.  Make sure that you have a light by your bed that is easy to reach.  Do not use any sheets or blankets that are too big for your bed. They should not hang down onto the floor.  Have a firm chair that has side arms. You can use this for support while you get dressed.  Do not have throw rugs and other things on the floor that can make you trip. What can I do in the kitchen?  Clean up any spills right away.  Avoid walking on wet floors.  Keep items that you use a lot in easy-to-reach places.  If you need to reach something above you, use a strong step stool that has a grab bar.  Keep electrical cords out of the way.  Do not use floor polish or wax that makes floors slippery. If you must use wax, use non-skid floor wax.  Do not have throw rugs and other things on the floor that can make you trip. What can I do  with my stairs?  Do not leave any items on the stairs.  Make sure that there are handrails on both sides of the stairs and use them. Fix handrails that are broken or loose. Make sure that handrails are as long as the stairways.  Check any carpeting to make sure that it is firmly attached to the stairs. Fix any carpet that is loose or worn.  Avoid having throw rugs at the top or bottom of the stairs. If you do have throw rugs, attach them to the floor with carpet tape.  Make sure that you have a light switch at the top of the stairs and the bottom of the stairs. If you do not have them, ask someone to add them for you. What else can I do to help prevent falls?  Wear shoes that:  Do not have high heels.  Have rubber bottoms.  Are comfortable and fit you well.  Are closed at the toe. Do not wear sandals.  If you use a  stepladder:  Make sure that it is fully opened. Do not climb a closed stepladder.  Make sure that both sides of the stepladder are locked into place.  Ask someone to hold it for you, if possible.  Clearly mark and make sure that you can see:  Any grab bars or handrails.  First and last steps.  Where the edge of each step is.  Use tools that help you move around (mobility aids) if they are needed. These include:  Canes.  Walkers.  Scooters.  Crutches.  Turn on the lights when you go into a dark area. Replace any light bulbs as soon as they burn out.  Set up your furniture so you have a clear path. Avoid moving your furniture around.  If any of your floors are uneven, fix them.  If there are any pets around you, be aware of where they are.  Review your medicines with your doctor. Some medicines can make you feel dizzy. This can increase your chance of falling. Ask your doctor what other things that you can do to help prevent falls. This information is not intended to replace advice given to you by your health care provider. Make sure you discuss any questions you have with your health care provider. Document Released: 02/04/2009 Document Revised: 09/16/2015 Document Reviewed: 05/15/2014 Elsevier Interactive Patient Education  2017 [employer].

## 2020-07-13 ENCOUNTER — Telehealth: Payer: Self-pay | Admitting: Family Medicine

## 2020-07-13 NOTE — Telephone Encounter (Signed)
Patient dropped off disability placard form. Placed in Dr. Lucita Lora inbox in front office to be signed.

## 2020-07-14 NOTE — Telephone Encounter (Signed)
Form received will place on PCP desk

## 2020-07-14 NOTE — Telephone Encounter (Signed)
Completed and returned to CMA work basket ?

## 2020-07-14 NOTE — Telephone Encounter (Signed)
Mailed per pt request.

## 2020-07-14 NOTE — Telephone Encounter (Signed)
Awaiting form

## 2020-08-10 DIAGNOSIS — M170 Bilateral primary osteoarthritis of knee: Secondary | ICD-10-CM | POA: Diagnosis not present

## 2020-08-25 ENCOUNTER — Encounter: Payer: Self-pay | Admitting: Family Medicine

## 2020-08-25 ENCOUNTER — Ambulatory Visit (INDEPENDENT_AMBULATORY_CARE_PROVIDER_SITE_OTHER): Payer: Medicare HMO | Admitting: Family Medicine

## 2020-08-25 ENCOUNTER — Other Ambulatory Visit: Payer: Self-pay

## 2020-08-25 VITALS — BP 128/78 | HR 63 | Temp 98.2°F | Ht 65.0 in | Wt 189.0 lb

## 2020-08-25 DIAGNOSIS — E559 Vitamin D deficiency, unspecified: Secondary | ICD-10-CM

## 2020-08-25 DIAGNOSIS — E7800 Pure hypercholesterolemia, unspecified: Secondary | ICD-10-CM | POA: Diagnosis not present

## 2020-08-25 DIAGNOSIS — M4126 Other idiopathic scoliosis, lumbar region: Secondary | ICD-10-CM

## 2020-08-25 DIAGNOSIS — E669 Obesity, unspecified: Secondary | ICD-10-CM

## 2020-08-25 DIAGNOSIS — M349 Systemic sclerosis, unspecified: Secondary | ICD-10-CM | POA: Diagnosis not present

## 2020-08-25 DIAGNOSIS — E782 Mixed hyperlipidemia: Secondary | ICD-10-CM

## 2020-08-25 DIAGNOSIS — J439 Emphysema, unspecified: Secondary | ICD-10-CM

## 2020-08-25 DIAGNOSIS — M1990 Unspecified osteoarthritis, unspecified site: Secondary | ICD-10-CM | POA: Diagnosis not present

## 2020-08-25 DIAGNOSIS — I10 Essential (primary) hypertension: Secondary | ICD-10-CM

## 2020-08-25 DIAGNOSIS — I7300 Raynaud's syndrome without gangrene: Secondary | ICD-10-CM | POA: Diagnosis not present

## 2020-08-25 MED ORDER — HYDROCHLOROTHIAZIDE 25 MG PO TABS: 25.0000 mg | ORAL_TABLET | Freq: Every day | ORAL | 1 refills | Status: DC

## 2020-08-25 MED ORDER — ALBUTEROL SULFATE HFA 108 (90 BASE) MCG/ACT IN AERS: 2.0000 | INHALATION_SPRAY | Freq: Four times a day (QID) | RESPIRATORY_TRACT | 5 refills | Status: DC | PRN

## 2020-08-25 MED ORDER — ATORVASTATIN CALCIUM 20 MG PO TABS: 20.0000 mg | ORAL_TABLET | Freq: Every day | ORAL | 3 refills | Status: DC

## 2020-08-25 MED ORDER — DICLOFENAC SODIUM ER 100 MG PO TB24: 100.0000 mg | ORAL_TABLET | Freq: Every day | ORAL | 1 refills | Status: DC
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
## 2020-08-25 NOTE — Patient Instructions (Addendum)
  Great to see you today.  I have refilled the medication(s) we provide.   If labs were collected, we will inform you of lab results once received either by echart message or telephone call.   - echart message- for normal results that have been seen by the patient already.   - telephone call: abnormal results or if patient has not viewed results in their echart.   Great job on the weight loss!!!!  Have your orthopedic fax over the surgical clearance form and we will get you in to get it completed.

## 2020-09-01 DIAGNOSIS — M25561 Pain in right knee: Secondary | ICD-10-CM | POA: Diagnosis not present

## 2020-09-09 ENCOUNTER — Telehealth: Payer: Self-pay

## 2020-09-09 NOTE — Telephone Encounter (Signed)
Surgical clearance forms received on 09/09/20. Patient has not sched to surgical clearance appt. Pt states that she was just in office 08/25/20. Forms have been placed on providers desk

## 2020-09-10 ENCOUNTER — Telehealth: Payer: Self-pay | Admitting: Family Medicine

## 2020-09-10 NOTE — Telephone Encounter (Signed)
Pt returning call but nurses were at lunch, I let her know someone would call her back

## 2020-09-10 NOTE — Telephone Encounter (Signed)
We discussed her surgical clearance at her appointment 2 weeks ago.  I told her would be dependent upon what they were requiring for the clearance if we could fill out the form without seeing her again.  It does appear that she will need nothing additional collected, therefore I have completed her form.  Please advise her to be faxed over to [facility] today.  Good luck on her surgery.

## 2020-09-10 NOTE — Telephone Encounter (Signed)
LM for pt to return call to discuss.  

## 2020-09-10 NOTE — Telephone Encounter (Signed)
Faxed forms to [facility] at [employer]

## 2020-09-10 NOTE — Telephone Encounter (Signed)
Pt informed forms was faxed

## 2020-09-10 NOTE — Telephone Encounter (Signed)
Routing to team pool

## 2020-09-28 DIAGNOSIS — R791 Abnormal coagulation profile: Secondary | ICD-10-CM | POA: Diagnosis not present

## 2020-09-28 DIAGNOSIS — M25561 Pain in right knee: Secondary | ICD-10-CM | POA: Diagnosis not present

## 2020-09-28 DIAGNOSIS — M1711 Unilateral primary osteoarthritis, right knee: Secondary | ICD-10-CM | POA: Diagnosis not present

## 2020-09-28 DIAGNOSIS — Z01812 Encounter for preprocedural laboratory examination: Secondary | ICD-10-CM | POA: Diagnosis not present

## 2020-09-30 ENCOUNTER — Other Ambulatory Visit: Payer: Self-pay
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
## 2020-10-01 ENCOUNTER — Ambulatory Visit: Payer: Medicare HMO | Admitting: Cardiology

## 2020-10-01 ENCOUNTER — Telehealth: Payer: Self-pay | Admitting: Cardiology

## 2020-10-01 ENCOUNTER — Telehealth: Payer: Self-pay

## 2020-10-01 ENCOUNTER — Encounter: Payer: Self-pay | Admitting: Cardiology

## 2020-10-01 VITALS — BP 120/90 | HR 84 | Ht 65.0 in | Wt 184.0 lb

## 2020-10-01 DIAGNOSIS — I493 Ventricular premature depolarization: Secondary | ICD-10-CM | POA: Diagnosis not present

## 2020-10-01 DIAGNOSIS — Z01810 Encounter for preprocedural cardiovascular examination: Secondary | ICD-10-CM

## 2020-10-01 DIAGNOSIS — I10 Essential (primary) hypertension: Secondary | ICD-10-CM

## 2020-10-01 DIAGNOSIS — R9431 Abnormal electrocardiogram [ECG] [EKG]: Secondary | ICD-10-CM | POA: Diagnosis not present

## 2020-10-01 NOTE — Telephone Encounter (Signed)
Waiting on results from echocardiogram.

## 2020-10-01 NOTE — Patient Instructions (Signed)
Medication Instructions:  Your physician recommends that you continue on your current medications as directed. Please refer to the Current Medication list given to you today.  *If you need a refill on your cardiac medications before your next appointment, please call your pharmacy*   Lab Work: None If you have labs (blood work) drawn today and your tests are completely normal, you will receive your results only by: . MyChart Message (if you have MyChart) OR . A paper copy in the mail If you have any lab test that is abnormal or we need to change your treatment, we will call you to review the results.   Testing/Procedures: Your physician has requested that you have an echocardiogram. Echocardiography is a painless test that uses sound waves to create images of your heart. It provides your doctor with information about the size and shape of your heart and how well your heart's chambers and valves are working. This procedure takes approximately one hour. There are no restrictions for this procedure.     Follow-Up: At [facility], you and your health needs are our priority.  As part of our continuing mission to provide you with exceptional heart care, we have created designated Provider Care Teams.  These Care Teams include your primary Cardiologist (physician) and Advanced Practice Providers (APPs -  Physician Assistants and Nurse Practitioners) who all work together to provide you with the care you need, when you need it.  We recommend signing up for the patient portal called "MyChart".  Sign up information is provided on this After Visit Summary.  MyChart is used to connect with patients for Virtual Visits (Telemedicine).  Patients are able to view lab/test results, encounter notes, upcoming appointments, etc.  Non-urgent messages can be sent to your provider as well.   To learn more about what you can do with MyChart, go to [email or web address].    Your next appointment:   As needed    The format for your next appointment:   In Person  Provider:   Brian Munley, MD   Other Instructions   

## 2020-10-01 NOTE — Telephone Encounter (Signed)
Brittney Myers is calling to report she is faxing the clearance notes over now for her appt this morning. Sending it to [facility] main fax #.

## 2020-10-01 NOTE — Telephone Encounter (Signed)
   [facility] Pre-operative Risk Assessment    Patient Name: Brittney Myers  DOB: 26-Dec-1955  MRN: [id number]   HEARTCARE STAFF: - Please ensure there is not already an duplicate clearance open for this procedure. - Under Visit Info/Reason for Call, type in Other and utilize the format Clearance MM/DD/YY or Clearance TBD. Do not use dashes or single digits. - If request is for dental extraction, please clarify the # of teeth to be extracted. - If the patient is currently at the dentist's office, call Pre-Op APP to address. If the patient is not currently in the dentist office, please route to the Pre-Op pool  Request for surgical clearance:  What type of surgery is being performed? Right total knee replacement   When is this surgery scheduled? 10/13/2020   What type of clearance is required (medical clearance vs. Pharmacy clearance to hold med vs. Both)? Both  Are there any medications that need to be held prior to surgery and how long? None noted   Practice name and name of physician performing surgery? [facility].    What is the office phone number? [phone]   7.   What is the office fax number? [phone]  8.   Anesthesia type (None, local, MAC, general) ? General   Gita Kudo 10/01/2020, 3:20 PM  _________________________________________________________________   (provider comments below)

## 2020-10-12 ENCOUNTER — Other Ambulatory Visit: Payer: Self-pay

## 2020-10-12 ENCOUNTER — Ambulatory Visit (HOSPITAL_COMMUNITY)
Admission: RE | Admit: 2020-10-12 | Discharge: 2020-10-12 | Disposition: A | Discharge status: Home / Self Care | Payer: Medicare HMO | Source: Ambulatory Visit | Attending: Cardiology | Admitting: Cardiology

## 2020-10-12 DIAGNOSIS — Z87891 Personal history of nicotine dependence: Secondary | ICD-10-CM | POA: Diagnosis not present

## 2020-10-12 DIAGNOSIS — Z01818 Encounter for other preprocedural examination: Secondary | ICD-10-CM | POA: Diagnosis not present

## 2020-10-12 DIAGNOSIS — I10 Essential (primary) hypertension: Secondary | ICD-10-CM | POA: Diagnosis not present

## 2020-10-12 DIAGNOSIS — R9431 Abnormal electrocardiogram [ECG] [EKG]: Secondary | ICD-10-CM | POA: Diagnosis not present

## 2020-10-12 DIAGNOSIS — Z01810 Encounter for preprocedural cardiovascular examination: Secondary | ICD-10-CM | POA: Diagnosis not present

## 2020-10-12 LAB — ECHOCARDIOGRAM COMPLETE
Area-P 1/2: 3.17 cm2
S' Lateral: 2.9 cm

## 2020-10-13 ENCOUNTER — Telehealth: Payer: Self-pay

## 2020-10-13 NOTE — Telephone Encounter (Signed)
-----   Message from Berniece Salines, DO sent at 10/13/2020  2:09 PM EDT ----- This is Dr. Joya Gaskins patient, echo normal

## 2020-10-13 NOTE — Telephone Encounter (Signed)
Spoke with patient regarding results and recommendation.  Patient verbalizes understanding and is agreeable to plan of care. Advised patient to call back with any issues or concerns.  

## 2020-10-19 NOTE — Telephone Encounter (Signed)
   Name: Brittney Myers  DOB: 07/05/1955  MRN: [id number]   Primary Cardiologist: Shirlee More, MD  Chart reviewed as part of pre-operative protocol coverage.  [name] was last seen on 10/01/20 by Dr. Bettina Gavia. Dr. Bettina Gavia cleared her for surgery pending echocardiogram, which was completed and essentially unremarkable.  Therefore, based on ACC/AHA guidelines, the patient would be at acceptable risk for the planned procedure without further cardiovascular testing.   I will route this recommendation to the requesting party via Epic fax function and remove from pre-op pool. Please call with questions.  Tami Lin Jnae [place], PA 10/19/2020, 8:11 AM

## 2020-10-27 DIAGNOSIS — M1711 Unilateral primary osteoarthritis, right knee: Secondary | ICD-10-CM | POA: Diagnosis not present

## 2020-10-27 DIAGNOSIS — Z96651 Presence of right artificial knee joint: Secondary | ICD-10-CM | POA: Diagnosis not present

## 2020-10-27 DIAGNOSIS — G8918 Other acute postprocedural pain: Secondary | ICD-10-CM | POA: Diagnosis not present

## 2020-10-27 HISTORY — PX: TOTAL KNEE ARTHROPLASTY: SHX125

## 2020-10-29 DIAGNOSIS — M25561 Pain in right knee: Secondary | ICD-10-CM | POA: Diagnosis not present

## 2020-11-01 DIAGNOSIS — M25561 Pain in right knee: Secondary | ICD-10-CM | POA: Diagnosis not present

## 2020-11-03 DIAGNOSIS — M25561 Pain in right knee: Secondary | ICD-10-CM | POA: Diagnosis not present

## 2020-11-09 DIAGNOSIS — M1711 Unilateral primary osteoarthritis, right knee: Secondary | ICD-10-CM | POA: Diagnosis not present

## 2020-11-10 DIAGNOSIS — M25561 Pain in right knee: Secondary | ICD-10-CM | POA: Diagnosis not present

## 2020-12-01 ENCOUNTER — Ambulatory Visit: Payer: Medicare HMO

## 2020-12-02 ENCOUNTER — Other Ambulatory Visit: Payer: Self-pay

## 2020-12-02 ENCOUNTER — Ambulatory Visit (INDEPENDENT_AMBULATORY_CARE_PROVIDER_SITE_OTHER): Payer: Medicare HMO

## 2020-12-02 DIAGNOSIS — Z1231 Encounter for screening mammogram for malignant neoplasm of breast: Secondary | ICD-10-CM

## 2020-12-06 DIAGNOSIS — M25561 Pain in right knee: Secondary | ICD-10-CM | POA: Diagnosis not present

## 2020-12-07 ENCOUNTER — Telehealth: Payer: Self-pay | Admitting: Family Medicine

## 2020-12-07 DIAGNOSIS — M1711 Unilateral primary osteoarthritis, right knee: Secondary | ICD-10-CM | POA: Diagnosis not present

## 2020-12-07 NOTE — Telephone Encounter (Signed)
Patient states she got a spider bite yesterday and her surgeon has prescribed doxycycline. Patient wants to be sure this is not in the penicillin family and she is not allergic to it. Please call patient to advise.

## 2020-12-07 NOTE — Telephone Encounter (Signed)
Spoke with pt to advise doxycyline is not in the same antibiotic class of medication. She should follow up with her surgeon if any issues with prescribed medication

## 2021-01-04 DIAGNOSIS — M1711 Unilateral primary osteoarthritis, right knee: Secondary | ICD-10-CM | POA: Diagnosis not present

## 2021-03-14 ENCOUNTER — Ambulatory Visit (INDEPENDENT_AMBULATORY_CARE_PROVIDER_SITE_OTHER): Payer: Medicare Other | Admitting: Family Medicine

## 2021-03-14 ENCOUNTER — Other Ambulatory Visit: Payer: Self-pay

## 2021-03-14 ENCOUNTER — Encounter: Payer: Self-pay | Admitting: Family Medicine

## 2021-03-14 VITALS — BP 123/74 | HR 64 | Temp 97.8°F | Ht 65.5 in | Wt 179.4 lb

## 2021-03-14 DIAGNOSIS — Z1231 Encounter for screening mammogram for malignant neoplasm of breast: Secondary | ICD-10-CM

## 2021-03-14 DIAGNOSIS — M2170 Unequal limb length (acquired), unspecified site: Secondary | ICD-10-CM

## 2021-03-14 DIAGNOSIS — E559 Vitamin D deficiency, unspecified: Secondary | ICD-10-CM

## 2021-03-14 DIAGNOSIS — M1990 Unspecified osteoarthritis, unspecified site: Secondary | ICD-10-CM

## 2021-03-14 DIAGNOSIS — I10 Essential (primary) hypertension: Secondary | ICD-10-CM | POA: Diagnosis not present

## 2021-03-14 DIAGNOSIS — Z0000 Encounter for general adult medical examination without abnormal findings: Secondary | ICD-10-CM

## 2021-03-14 DIAGNOSIS — E7800 Pure hypercholesterolemia, unspecified: Secondary | ICD-10-CM

## 2021-03-14 DIAGNOSIS — M4126 Other idiopathic scoliosis, lumbar region: Secondary | ICD-10-CM

## 2021-03-14 DIAGNOSIS — E663 Overweight: Secondary | ICD-10-CM

## 2021-03-14 DIAGNOSIS — Z23 Encounter for immunization: Secondary | ICD-10-CM

## 2021-03-14 DIAGNOSIS — J439 Emphysema, unspecified: Secondary | ICD-10-CM | POA: Diagnosis not present

## 2021-03-14 DIAGNOSIS — I7300 Raynaud's syndrome without gangrene: Secondary | ICD-10-CM

## 2021-03-14 DIAGNOSIS — Z131 Encounter for screening for diabetes mellitus: Secondary | ICD-10-CM | POA: Diagnosis not present

## 2021-03-14 DIAGNOSIS — M545 Low back pain, unspecified: Secondary | ICD-10-CM | POA: Diagnosis not present

## 2021-03-14 LAB — CBC
HCT: 39.4 % (ref 36.0–46.0)
Hemoglobin: 13.2 g/dL (ref 12.0–15.0)
MCHC: 33.4 g/dL (ref 30.0–36.0)
MCV: 87.6 fl (ref 78.0–100.0)
Platelets: 315 10*3/uL (ref 150.0–400.0)
RBC: 4.5 Mil/uL (ref 3.87–5.11)
RDW: 13.8 % (ref 11.5–15.5)
WBC: 5.6 10*3/uL (ref 4.0–10.5)

## 2021-03-14 LAB — TSH: TSH: 1.49 u[IU]/mL (ref 0.35–5.50)

## 2021-03-14 LAB — LIPID PANEL
Cholesterol: 146 mg/dL (ref 0–200)
HDL: 62.9 mg/dL (ref >39.00)
LDL Cholesterol: 62 mg/dL (ref 0–99)
NonHDL: 83.15
Total CHOL/HDL Ratio: 2
Triglycerides: 105 mg/dL (ref 0.0–149.0)
VLDL: 21 mg/dL (ref 0.0–40.0)

## 2021-03-14 LAB — COMPREHENSIVE METABOLIC PANEL
ALT: 37 U/L — ABNORMAL HIGH (ref 0–35)
AST: 30 U/L (ref 0–37)
Albumin: 4.5 g/dL (ref 3.5–5.2)
Alkaline Phosphatase: 148 U/L — ABNORMAL HIGH (ref 39–117)
BUN: 20 mg/dL (ref 6–23)
CO2: 31 mEq/L (ref 19–32)
Calcium: 10.1 mg/dL (ref 8.4–10.5)
Chloride: 102 mEq/L (ref 96–112)
Creatinine, Ser: 0.84 mg/dL (ref 0.40–1.20)
GFR: 73.94 mL/min (ref >60.00)
Glucose, Bld: 98 mg/dL (ref 70–99)
Potassium: 4.8 mEq/L (ref 3.5–5.1)
Sodium: 140 mEq/L (ref 135–145)
Total Bilirubin: 0.6 mg/dL (ref 0.2–1.2)
Total Protein: 7.4 g/dL (ref 6.0–8.3)

## 2021-03-14 LAB — VITAMIN D 25 HYDROXY (VIT D DEFICIENCY, FRACTURES): VITD: 43.42 ng/mL (ref 30.00–100.00)

## 2021-03-14 LAB — HEMOGLOBIN A1C: Hgb A1c MFr Bld: 6 % (ref 4.6–6.5)

## 2021-03-14 MED ORDER — DICLOFENAC SODIUM ER 100 MG PO TB24: 100.0000 mg | ORAL_TABLET | Freq: Every day | ORAL | 1 refills | Status: DC

## 2021-03-14 MED ORDER — HYDROCHLOROTHIAZIDE 25 MG PO TABS: 25.0000 mg | ORAL_TABLET | Freq: Every day | ORAL | 1 refills | Status: DC

## 2021-03-14 NOTE — Patient Instructions (Signed)
°Great to see you today.  °I have refilled the medication(s) we provide.  ° °If labs were collected, we will inform you of lab results once received either by echart message or telephone call.  ° - echart message- for normal results that have been seen by the patient already.  ° - telephone call: abnormal results or if patient has not viewed results in their echart. ° °Health Maintenance, Female °Adopting a healthy lifestyle and getting preventive care are important in promoting health and wellness. Ask your health care provider about: °The right schedule for you to have regular tests and exams. °Things you can do on your own to prevent diseases and keep yourself healthy. °What should I know about diet, weight, and exercise? °Eat a healthy diet ° °Eat a diet that includes plenty of vegetables, fruits, low-fat dairy products, and lean protein. °Do not eat a lot of foods that are high in solid fats, added sugars, or sodium. °Maintain a healthy weight °Body mass index (BMI) is used to identify weight problems. It estimates body fat based on height and weight. Your health care provider can help determine your BMI and help you achieve or maintain a healthy weight. °Get regular exercise °Get regular exercise. This is one of the most important things you can do for your health. Most adults should: °Exercise for at least 150 minutes each week. The exercise should increase your heart rate and make you sweat (moderate-intensity exercise). °Do strengthening exercises at least twice a week. This is in addition to the moderate-intensity exercise. °Spend less time sitting. Even light physical activity can be beneficial. °Watch cholesterol and blood lipids °Have your blood tested for lipids and cholesterol at 63 years of age, then have this test every 5 years. °Have your cholesterol levels checked more often if: °Your lipid or cholesterol levels are high. °You are older than 63 years of age. °You are at high risk for heart  disease. °What should I know about cancer screening? °Depending on your health history and family history, you may need to have cancer screening at various ages. This may include screening for: °Breast cancer. °Cervical cancer. °Colorectal cancer. °Skin cancer. °Lung cancer. °What should I know about heart disease, diabetes, and high blood pressure? °Blood pressure and heart disease °High blood pressure causes heart disease and increases the risk of stroke. This is more likely to develop in people who have high blood pressure readings or are overweight. °Have your blood pressure checked: °Every 3-5 years if you are 18-39 years of age. °Every year if you are 40 years old or older. °Diabetes °Have regular diabetes screenings. This checks your fasting blood sugar level. Have the screening done: °Once every three years after age 40 if you are at a normal weight and have a low risk for diabetes. °More often and at a younger age if you are overweight or have a high risk for diabetes. °What should I know about preventing infection? °Hepatitis B °If you have a higher risk for hepatitis B, you should be screened for this virus. Talk with your health care provider to find out if you are at risk for hepatitis B infection. °Hepatitis C °Testing is recommended for: °Everyone born from 1945 through 1965. °Anyone with known risk factors for hepatitis C. °Sexually transmitted infections (STIs) °Get screened for STIs, including gonorrhea and chlamydia, if: °You are sexually active and are younger than 63 years of age. °You are older than 63 years of age and your health care provider   tells you that you are at risk for this type of infection. °Your sexual activity has changed since you were last screened, and you are at increased risk for chlamydia or gonorrhea. Ask your health care provider if you are at risk. °Ask your health care provider about whether you are at high risk for HIV. Your health care provider may recommend a  prescription medicine to help prevent HIV infection. If you choose to take medicine to prevent HIV, you should first get tested for HIV. You should then be tested every 3 months for as long as you are taking the medicine. °Pregnancy °If you are about to stop having your period (premenopausal) and you may become pregnant, seek counseling before you get pregnant. °Take 400 to 800 micrograms (mcg) of folic acid every day if you become pregnant. °Ask for birth control (contraception) if you want to prevent pregnancy. °Osteoporosis and menopause °Osteoporosis is a disease in which the bones lose minerals and strength with aging. This can result in bone fractures. If you are 65 years old or older, or if you are at risk for osteoporosis and fractures, ask your health care provider if you should: °Be screened for bone loss. °Take a calcium or vitamin D supplement to lower your risk of fractures. °Be given hormone replacement therapy (HRT) to treat symptoms of menopause. °Follow these instructions at home: °Alcohol use °Do not drink alcohol if: °Your health care provider tells you not to drink. °You are pregnant, may be pregnant, or are planning to become pregnant. °If you drink alcohol: °Limit how much you have to: °0-1 drink a day. °Know how much alcohol is in your drink. In the [place] one drink equals one 12 oz bottle of beer (355 mL), one 5 oz glass of wine (148 mL), or one 1½ oz glass of hard liquor (44 mL). °Lifestyle °Do not use any products that contain nicotine or tobacco. These products include cigarettes, chewing tobacco, and vaping devices, such as e-cigarettes. If you need help quitting, ask your health care provider. °Do not use street drugs. °Do not share needles. °Ask your health care provider for help if you need support or information about quitting drugs. °General instructions °Schedule regular health, dental, and eye exams. °Stay current with your vaccines. °Tell your health care provider if: °You often  feel depressed. °You have ever been abused or do not feel safe at home. °Summary °Adopting a healthy lifestyle and getting preventive care are important in promoting health and wellness. °Follow your health care provider's instructions about healthy diet, exercising, and getting tested or screened for diseases. °Follow your health care provider's instructions on monitoring your cholesterol and blood pressure. °This information is not intended to replace advice given to you by your health care provider. Make sure you discuss any questions you have with your health care provider. °Document Revised: 08/30/2020 Document Reviewed: 08/30/2020 °Elsevier Patient Education © 2022 [employer] ° °

## 2021-03-14 NOTE — Addendum Note (Signed)
Addended by: Kavin Leech on: 03/14/2021 09:08 AM   Modules accepted: Orders
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
## 2021-03-14 NOTE — Addendum Note (Signed)
Addended by: Howard Pouch A on: 03/14/2021 12:37 PM   Modules accepted: Orders

## 2021-03-22 NOTE — Progress Notes (Signed)
Brittney Myers D.[place] Phone: [phone]   Assessment and Plan:     1. Chronic left-sided low back pain with left-sided sciatica -Chronic with exacerbation, initial sports medicine visit - Left-sided low back pain with radicular symptoms in the left leg since having knee replacement in 10/2020.  Likely that patient was favoring left leg after surgery leading to this pain and dysfunction - No red flag symptoms on today's visit so no imaging performed - Start meloxicam 15 mg daily for 2 weeks, then may use remaining as needed for pain control - Stop diclofenac while taking meloxicam.  Medication should not be used at the same time.  Patient may restart diclofenac after finishing course of meloxicam - Start HEP for sciatica.  Handout provided and instructions given    Pertinent previous records reviewed include none   Follow Up: 3 weeks for reevaluation.  Could consider greater trochanteric CSI versus referral to formal PT if no improvement or worsening symptoms   Subjective:   I, Margaruite Myers, am serving as a scribe for Dr. Glennon Mac  Chief Complaint: Low back pain   HPI:   03/23/21 Patient is a 65 year old female presenting with low back pain. Patient locates her pain to  bilateral low back pain worse on the left. Patient states that she had right knee replacement and feels like her right leg is longer since the surgery causing her hip and back pain. Patient states the pain shoots from her low back on left side to her knee causing sharp pain in her Lateral left knee. Patient saw her PCP on 03/14/21 for routine office visit and low back pain, was referred to sports medicine for possible OMT or shoe inserts if appropriate.   Radiates: yes down the left leg  Numbness/tingling: no Weakness: yes  Aggravates: sitting standing position Treatments tried: diclofenac, massage,    Relevant Historical  Information: Right knee replacement July 2022, hypertension, COPD  Additional pertinent review of systems negative.   Current Outpatient Medications:    albuterol (VENTOLIN HFA) 108 (90 Base) MCG/ACT inhaler, Inhale 2 puffs into the lungs every 6 (six) hours as needed for wheezing or shortness of breath., Disp: 16 g, Rfl: 5   atorvastatin (LIPITOR) 20 MG tablet, Take 1 tablet (20 mg total) by mouth daily., Disp: 90 tablet, Rfl: 3   B Complex-C (B-COMPLEX WITH VITAMIN C) tablet, Take 1 tablet by mouth daily., Disp: , Rfl:    cholecalciferol (VITAMIN D) 1000 UNITS tablet, Take 2,000 Units by mouth daily., Disp: , Rfl:    Diclofenac Sodium CR 100 MG 24 hr tablet, Take 1 tablet (100 mg total) by mouth daily., Disp: 90 tablet, Rfl: 1   fluticasone (FLONASE) 50 MCG/ACT nasal spray, Place 2 sprays into both nostrils daily., Disp: 16 g, Rfl: 6   hydrochlorothiazide (HYDRODIURIL) 25 MG tablet, Take 1 tablet (25 mg total) by mouth daily., Disp: 90 tablet, Rfl: 1   meloxicam (MOBIC) 15 MG tablet, Take 1 tablet (15 mg total) by mouth daily., Disp: 30 tablet, Rfl: 0   Multiple Vitamin (MULTIVITAMIN) tablet, Take 1 tablet by mouth daily., Disp: , Rfl:    Probiotic Product (ACIDOPHILUS, PROBIOTIC, PO), Take by mouth. 2 capsules by mouth daily, Disp: , Rfl:    Objective:     Vitals:   03/23/21 1028  BP: 122/80  Pulse: (!) 57  SpO2: 98%  Weight: 182 lb (82.6 kg)  Height: 5'  5" (1.651 m)      Body mass index is 30.29 kg/m.    Physical Exam:    General: awake, alert, and oriented no acute distress, nontoxic Skin: no suspicious lesions or rashes Neuro:sensation intact distally with no dificits, normal muscle tone, no atrophy, strength 5/5 in all tested lower ext groups Psych: normal mood and affect, speech clear  Left hip: No deformity, swelling or wasting ROM Fexion 90, ext 30, IR 45, ER 45 TTP greater trochanter, gluteal musculature lumbar paraspinal, on left NTTP over the hip flexors, si  joint Negative log roll with FROM Negative FABER Negative FADIR Positive piriformis test Negative trendelenberg Gait normal    Electronically signed by:  Brittney Myers D.[facility] 11:06 AM 03/23/21

## 2021-03-23 ENCOUNTER — Other Ambulatory Visit: Payer: Self-pay

## 2021-03-23 ENCOUNTER — Ambulatory Visit (INDEPENDENT_AMBULATORY_CARE_PROVIDER_SITE_OTHER): Payer: Medicare Other | Admitting: Sports Medicine

## 2021-03-23 VITALS — BP 122/80 | HR 57 | Ht 65.0 in | Wt 182.0 lb

## 2021-03-23 DIAGNOSIS — M5442 Lumbago with sciatica, left side: Secondary | ICD-10-CM

## 2021-03-23 DIAGNOSIS — G8929 Other chronic pain: Secondary | ICD-10-CM | POA: Diagnosis not present

## 2021-03-23 MED ORDER — MELOXICAM 15 MG PO TABS: 15.0000 mg | ORAL_TABLET | Freq: Every day | ORAL | 0 refills | Status: DC

## 2021-03-23 NOTE — Patient Instructions (Addendum)
Good to see you  Sciatica exercises given  Meloxicam 15mg  for two weeks and the rest as needed Stop taking diclofenac while taking meloxicam  Follow up in 3 weeks

## 2021-04-05 ENCOUNTER — Telehealth: Payer: Self-pay | Admitting: Family Medicine

## 2021-04-05 ENCOUNTER — Other Ambulatory Visit: Payer: Self-pay

## 2021-04-05 ENCOUNTER — Telehealth (INDEPENDENT_AMBULATORY_CARE_PROVIDER_SITE_OTHER): Payer: Medicare Other | Admitting: Family Medicine

## 2021-04-05 ENCOUNTER — Encounter: Payer: Self-pay | Admitting: Family Medicine

## 2021-04-05 VITALS — Temp 98.7°F

## 2021-04-05 DIAGNOSIS — R051 Acute cough: Secondary | ICD-10-CM | POA: Diagnosis not present

## 2021-04-05 DIAGNOSIS — J988 Other specified respiratory disorders: Secondary | ICD-10-CM | POA: Diagnosis not present

## 2021-04-05 MED ORDER — BENZONATATE 200 MG PO CAPS: 200.0000 mg | ORAL_CAPSULE | Freq: Two times a day (BID) | ORAL | 0 refills | Status: DC | PRN

## 2021-04-05 MED ORDER — FLUTICASONE PROPIONATE 50 MCG/ACT NA SUSP
2.0000 | Freq: Every day | NASAL | 6 refills | Status: DC
Start: 2021-04-05 — End: 2021-09-05

## 2021-04-05 MED ORDER — DOXYCYCLINE HYCLATE 100 MG PO TABS: 100.0000 mg | ORAL_TABLET | Freq: Two times a day (BID) | ORAL | 0 refills | Status: DC

## 2021-04-05 NOTE — Telephone Encounter (Signed)
Called pt and sched her for 330 VV

## 2021-04-05 NOTE — Patient Instructions (Signed)

## 2021-04-05 NOTE — Telephone Encounter (Signed)
Pt called and said she lives in [place] so doesn't want to drive all the way down here if she doesn't have to. She said she woke up with a pounding headache, a cough and he nose is running. She wants to know what she should take over the counter to help her out and stop from getting into her chest. Please advise. Call back [phone].

## 2021-04-05 NOTE — Progress Notes (Signed)
VIRTUAL VISIT VIA VIDEO  I connected with Brittney Myers on 04/05/21 at  3:30 PM EST by elemedicine application and verified that I am speaking with the correct person using two identifiers. Location patient: Home Location provider: [facility], Office Persons participating in the virtual visit: Patient, Dr. Raoul Pitch and Darnell Level. Cesar, CMA  I discussed the limitations of evaluation and management by telemedicine and the availability of in person appointments. The patient expressed understanding and agreed to proceed.   SUBJECTIVE Chief Complaint  Patient presents with   Cough    Pt c/o productive cough, nasal drainage, post nasal drip, HA this morning;     HPI: Brittney Myers is a 65 y.o. female present today for acute illness. She woke this morning with productive cough, nasal drainage, PND and frontal/max hdx. She denies nausea, vomit, rash or diarrhea. Her grandson is sick with a sinus infection. She cares for him in her home during the day. He tested negative for influenza and covid with this illness. Lajeana is tolerating PO. She is UTD on her influenza vaccines this year and her covid vaccines.   ROS: See pertinent positives and negatives per HPI.  Patient Active Problem List   Diagnosis Date Noted   Obesity (BMI 30-39.9) 03/10/2019   Elevated LDL cholesterol level 02/28/2018   Other idiopathic scoliosis, lumbar region 09/05/2016   Scleroderma, limited ([place]) 08/31/2014   Vitamin D deficiency 08/31/2014   Raynaud's disease 04/06/2014   Essential hypertension 03/26/2014   Arthritis    COPD with emphysema ([place])     Social History   Tobacco Use   Smoking status: Former    Packs/day: 0.50    Years: 30.00    Pack years: 15.00    Types: Cigarettes    Start date: 04/24/1973    Quit date: 08/18/2013    Years since quitting: 7.6   Smokeless tobacco: Never  Substance Use Topics   Alcohol use: No    Alcohol/week: 0.0 standard drinks    Current Outpatient Medications:     albuterol (VENTOLIN HFA) 108 (90 Base) MCG/ACT inhaler, Inhale 2 puffs into the lungs every 6 (six) hours as needed for wheezing or shortness of breath., Disp: 16 g, Rfl: 5   atorvastatin (LIPITOR) 20 MG tablet, Take 1 tablet (20 mg total) by mouth daily., Disp: 90 tablet, Rfl: 3   B Complex-C (B-COMPLEX WITH VITAMIN C) tablet, Take 1 tablet by mouth daily., Disp: , Rfl:    benzonatate (TESSALON) 200 MG capsule, Take 1 capsule (200 mg total) by mouth 2 (two) times daily as needed for cough., Disp: 20 capsule, Rfl: 0   cholecalciferol (VITAMIN D) 1000 UNITS tablet, Take 2,000 Units by mouth daily., Disp: , Rfl:    Diclofenac Sodium CR 100 MG 24 hr tablet, Take 1 tablet (100 mg total) by mouth daily., Disp: 90 tablet, Rfl: 1   doxycycline (VIBRA-TABS) 100 MG tablet, Take 1 tablet (100 mg total) by mouth 2 (two) times daily., Disp: 20 tablet, Rfl: 0   hydrochlorothiazide (HYDRODIURIL) 25 MG tablet, Take 1 tablet (25 mg total) by mouth daily., Disp: 90 tablet, Rfl: 1   Multiple Vitamin (MULTIVITAMIN) tablet, Take 1 tablet by mouth daily., Disp: , Rfl:    Probiotic Product (ACIDOPHILUS, PROBIOTIC, PO), Take by mouth. 2 capsules by mouth daily, Disp: , Rfl:    fluticasone (FLONASE) 50 MCG/ACT nasal spray, Place 2 sprays into both nostrils daily., Disp: 16 g, Rfl: 6  Allergies  Allergen Reactions   Codeine Other (  See Comments)    Hallucinations Other reaction(s): Delusions (intolerance), Other (See Comments) Hallucinations Hallucinations Other reaction(s): Delusions (intolerance), Other (See Comments) Hallucinations Hallucinations Hallucinations Hallucinations   Penicillins Rash   Prednisone Rash    OBJECTIVE: Temp 98.7 F (37.1 C) (Temporal)  Gen: No acute distress. Nontoxic in appearance.  HENT: AT. [place].  MMM.  Eyes:Pupils Equal Round Reactive to light, Extraocular movements intact,  Conjunctiva without redness, discharge or icterus. Chest: Cough mild present. No shortness of breath.  Normal WOB.  Skin: no rashes, purpura or petechiae.  Neuro:   Alert. Oriented x3  Psych: Normal affect and demeanor. Normal speech. Normal thought content and judgment.  ASSESSMENT AND PLAN: [name] is a 65 y.o. female present for  Acute cough/Respiratory infection Discussed her sx are likely viral at this time and abx would not be helpful. Pt understood. Tx w/ supportive measures and if sx remain or worsen after 7 days may start abx prescribed. Pt reported understanding.  Rest, hydrate.  Start flonase, mucinex (DM if cough), nettie pot or nasal saline.  Tessalon perles prescribed for cough Doxy bid prescribed, take until completed IF started after day 7  If cough present it can last up to 6-8 weeks.  F/U 2 weeks if not improved.    Howard Pouch, DO 04/05/2021   Return in about 2 weeks (around 04/19/2021), or if symptoms worsen or fail to improve.  No orders of the defined types were placed in this encounter.  Meds ordered this encounter  Medications   benzonatate (TESSALON) 200 MG capsule    Sig: Take 1 capsule (200 mg total) by mouth 2 (two) times daily as needed for cough.    Dispense:  20 capsule    Refill:  0   doxycycline (VIBRA-TABS) 100 MG tablet    Sig: Take 1 tablet (100 mg total) by mouth 2 (two) times daily.    Dispense:  20 tablet    Refill:  0   fluticasone (FLONASE) 50 MCG/ACT nasal spray    Sig: Place 2 sprays into both nostrils daily.    Dispense:  16 g    Refill:  6   Referral Orders  No referral(s) requested today

## 2021-04-13 ENCOUNTER — Ambulatory Visit: Payer: Medicare Other | Admitting: Sports Medicine

## 2021-04-20 ENCOUNTER — Other Ambulatory Visit: Payer: Self-pay | Admitting: Sports Medicine

## 2021-06-10 ENCOUNTER — Other Ambulatory Visit: Payer: Self-pay | Admitting: Family Medicine

## 2021-06-10 DIAGNOSIS — E782 Mixed hyperlipidemia: Secondary | ICD-10-CM

## 2021-06-11 ENCOUNTER — Other Ambulatory Visit: Payer: Self-pay | Admitting: Sports Medicine

## 2021-07-06 ENCOUNTER — Other Ambulatory Visit: Payer: Self-pay

## 2021-07-06 ENCOUNTER — Ambulatory Visit (INDEPENDENT_AMBULATORY_CARE_PROVIDER_SITE_OTHER): Payer: Medicare Other

## 2021-07-06 DIAGNOSIS — Z0000 Encounter for general adult medical examination without abnormal findings: Secondary | ICD-10-CM | POA: Diagnosis not present

## 2021-07-06 NOTE — Patient Instructions (Signed)
Ms. Dhingra , ?Thank you for taking time to come for your Medicare Wellness Visit. I appreciate your ongoing commitment to your health goals. Please review the following plan we discussed and let me know if I can assist you in the future.  ? ?Screening recommendations/referrals: ?Colonoscopy: Done 01/01/18 repeat every 5 years ?Mammogram: Done 12/02/20 repeat every year  ?Bone Density: Done 01/28/16 repeat every 2 years  ?Recommended yearly ophthalmology/optometry visit for glaucoma screening and checkup ?Recommended yearly dental visit for hygiene and checkup ? ?Vaccinations: ?Influenza vaccine: Done 01/17/21 repeat every year  ?Pneumococcal vaccine: Up to date ?Tdap vaccine: Done 09/29/20 repeat every 10 years  ?Shingles vaccine: Completed 09/26/17, 11/26/17   ?Covid-19:Completed 3/10, 4/7, 03/03/20 & 01/17/21 ? ?Advanced directives: Please bring a copy of your health care power of attorney and living will to the office at your convenience. ? ?Conditions/risks identified: Lose weight  ? ?Next appointment: Follow up in one year for your annual wellness visit  ? ? ?Preventive Care 64 Years and Older, Female ?Preventive care refers to lifestyle choices and visits with your health care provider that can promote health and wellness. ?What does preventive care include? ?A yearly physical exam. This is also called an annual well check. ?Dental exams once or twice a year. ?Routine eye exams. Ask your health care provider how often you should have your eyes checked. ?Personal lifestyle choices, including: ?Daily care of your teeth and gums. ?Regular physical activity. ?Eating a healthy diet. ?Avoiding tobacco and drug use. ?Limiting alcohol use. ?Practicing safe sex. ?Taking low-dose aspirin every day. ?Taking vitamin and mineral supplements as recommended by your health care provider. ?What happens during an annual well check? ?The services and screenings done by your health care provider during your annual well check will depend on  your age, overall health, lifestyle risk factors, and family history of disease. ?Counseling  ?Your health care provider may ask you questions about your: ?Alcohol use. ?Tobacco use. ?Drug use. ?Emotional well-being. ?Home and relationship well-being. ?Sexual activity. ?Eating habits. ?History of falls. ?Memory and ability to understand (cognition). ?Work and work Statistician. ?Reproductive health. ?Screening  ?You may have the following tests or measurements: ?Height, weight, and BMI. ?Blood pressure. ?Lipid and cholesterol levels. These may be checked every 5 years, or more frequently if you are over 30 years old. ?Skin check. ?Lung cancer screening. You may have this screening every year starting at age 64 if you have a 30-pack-year history of smoking and currently smoke or have quit within the past 15 years. ?Fecal occult blood test (FOBT) of the stool. You may have this test every year starting at age 64. ?Flexible sigmoidoscopy or colonoscopy. You may have a sigmoidoscopy every 5 years or a colonoscopy every 10 years starting at age 64. ?Hepatitis C blood test. ?Hepatitis B blood test. ?Sexually transmitted disease (STD) testing. ?Diabetes screening. This is done by checking your blood sugar (glucose) after you have not eaten for a while (fasting). You may have this done every 1-3 years. ?Bone density scan. This is done to screen for osteoporosis. You may have this done starting at age 64. ?Mammogram. This may be done every 1-2 years. Talk to your health care provider about how often you should have regular mammograms. ?Talk with your health care provider about your test results, treatment options, and if necessary, the need for more tests. ?Vaccines  ?Your health care provider may recommend certain vaccines, such as: ?Influenza vaccine. This is recommended every year. ?Tetanus, diphtheria, and acellular  pertussis (Tdap, Td) vaccine. You may need a Td booster every 10 years. ?Zoster vaccine. You may need this  after age 50. ?Pneumococcal 13-valent conjugate (PCV13) vaccine. One dose is recommended after age 64. ?Pneumococcal polysaccharide (PPSV23) vaccine. One dose is recommended after age 64. ?Talk to your health care provider about which screenings and vaccines you need and how often you need them. ?This information is not intended to replace advice given to you by your health care provider. Make sure you discuss any questions you have with your health care provider. ?Document Released: 05/07/2015 Document Revised: 12/29/2015 Document Reviewed: 02/09/2015 ?Elsevier Interactive Patient Education ? 2017 [place]. ? ?Fall Prevention in the Home ?Falls can cause injuries. They can happen to people of all ages. There are many things you can do to make your home safe and to help prevent falls. ?What can I do on the outside of my home? ?Regularly fix the edges of walkways and driveways and fix any cracks. ?Remove anything that might make you trip as you walk through a door, such as a raised step or threshold. ?Trim any bushes or trees on the path to your home. ?Use bright outdoor lighting. ?Clear any walking paths of anything that might make someone trip, such as rocks or tools. ?Regularly check to see if handrails are loose or broken. Make sure that both sides of any steps have handrails. ?Any raised decks and porches should have guardrails on the edges. ?Have any leaves, snow, or ice cleared regularly. ?Use sand or salt on walking paths during winter. ?Clean up any spills in your garage right away. This includes oil or grease spills. ?What can I do in the bathroom? ?Use night lights. ?Install grab bars by the toilet and in the tub and shower. Do not use towel bars as grab bars. ?Use non-skid mats or decals in the tub or shower. ?If you need to sit down in the shower, use a plastic, non-slip stool. ?Keep the floor dry. Clean up any water that spills on the floor as soon as it happens. ?Remove soap buildup in the tub or  shower regularly. ?Attach bath mats securely with double-sided non-slip rug tape. ?Do not have throw rugs and other things on the floor that can make you trip. ?What can I do in the bedroom? ?Use night lights. ?Make sure that you have a light by your bed that is easy to reach. ?Do not use any sheets or blankets that are too big for your bed. They should not hang down onto the floor. ?Have a firm chair that has side arms. You can use this for support while you get dressed. ?Do not have throw rugs and other things on the floor that can make you trip. ?What can I do in the kitchen? ?Clean up any spills right away. ?Avoid walking on wet floors. ?Keep items that you use a lot in easy-to-reach places. ?If you need to reach something above you, use a strong step stool that has a grab bar. ?Keep electrical cords out of the way. ?Do not use floor polish or wax that makes floors slippery. If you must use wax, use non-skid floor wax. ?Do not have throw rugs and other things on the floor that can make you trip. ?What can I do with my stairs? ?Do not leave any items on the stairs. ?Make sure that there are handrails on both sides of the stairs and use them. Fix handrails that are broken or loose. Make sure that handrails  are as long as the stairways. ?Check any carpeting to make sure that it is firmly attached to the stairs. Fix any carpet that is loose or worn. ?Avoid having throw rugs at the top or bottom of the stairs. If you do have throw rugs, attach them to the floor with carpet tape. ?Make sure that you have a light switch at the top of the stairs and the bottom of the stairs. If you do not have them, ask someone to add them for you. ?What else can I do to help prevent falls? ?Wear shoes that: ?Do not have high heels. ?Have rubber bottoms. ?Are comfortable and fit you well. ?Are closed at the toe. Do not wear sandals. ?If you use a stepladder: ?Make sure that it is fully opened. Do not climb a closed stepladder. ?Make  sure that both sides of the stepladder are locked into place. ?Ask someone to hold it for you, if possible. ?Clearly mark and make sure that you can see: ?Any grab bars or handrails. ?First and last step
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
## 2021-08-18 ENCOUNTER — Other Ambulatory Visit: Payer: Self-pay | Admitting: Family Medicine

## 2021-08-18 DIAGNOSIS — E782 Mixed hyperlipidemia: Secondary | ICD-10-CM

## 2021-09-05 ENCOUNTER — Ambulatory Visit (INDEPENDENT_AMBULATORY_CARE_PROVIDER_SITE_OTHER): Payer: Medicare Other | Admitting: Family Medicine

## 2021-09-05 ENCOUNTER — Encounter: Payer: Self-pay | Admitting: Family Medicine

## 2021-09-05 DIAGNOSIS — J439 Emphysema, unspecified: Secondary | ICD-10-CM | POA: Diagnosis not present

## 2021-09-05 DIAGNOSIS — E782 Mixed hyperlipidemia: Secondary | ICD-10-CM

## 2021-09-05 DIAGNOSIS — I10 Essential (primary) hypertension: Secondary | ICD-10-CM

## 2021-09-05 MED ORDER — DICLOFENAC SODIUM ER 100 MG PO TB24: 100.0000 mg | ORAL_TABLET | Freq: Every day | ORAL | 1 refills | Status: DC

## 2021-09-05 MED ORDER — HYDROCHLOROTHIAZIDE 25 MG PO TABS: 25.0000 mg | ORAL_TABLET | Freq: Every day | ORAL | 1 refills | Status: DC

## 2021-09-05 MED ORDER — PRAVASTATIN SODIUM 20 MG PO TABS
20.0000 mg | ORAL_TABLET | Freq: Every evening | ORAL | 3 refills | Status: DC
Start: 2021-09-05 — End: 2021-11-01

## 2021-09-05 NOTE — Patient Instructions (Addendum)
Return in about 6 months (around 03/16/2022) for cpe (20 min), Routine chronic condition follow-up. ? ? ? ? ? ? ? ?Great to see you today.  ?I have refilled the medication(s) we provide.  ? ?If labs were collected, we will inform you of lab results once received either by echart message or telephone call.  ? - echart message- for normal results that have been seen by the patient already.  ? - telephone call: abnormal results or if patient has not viewed results in their echart. ? ?
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
## 2021-09-08 ENCOUNTER — Other Ambulatory Visit: Payer: Self-pay

## 2021-09-08 DIAGNOSIS — E782 Mixed hyperlipidemia: Secondary | ICD-10-CM

## 2021-10-28 ENCOUNTER — Other Ambulatory Visit: Payer: Self-pay | Admitting: Family Medicine

## 2021-10-28 DIAGNOSIS — Z1231 Encounter for screening mammogram for malignant neoplasm of breast: Secondary | ICD-10-CM

## 2021-11-01 ENCOUNTER — Telehealth: Payer: Self-pay

## 2021-11-01 DIAGNOSIS — Z789 Other specified health status: Secondary | ICD-10-CM

## 2021-11-01 DIAGNOSIS — T466X5A Adverse effect of antihyperlipidemic and antiarteriosclerotic drugs, initial encounter: Secondary | ICD-10-CM | POA: Insufficient documentation

## 2021-11-01 MED ORDER — EZETIMIBE 10 MG PO TABS: 10.0000 mg | ORAL_TABLET | Freq: Every day | ORAL | 3 refills | Status: DC

## 2021-11-01 NOTE — Telephone Encounter (Signed)
Pt called stating that she is allergic to statin medications. When she takes it she gets nose bleeds, GI issues and leg cramps. Can she have something to replace statin medication. Pt given pravastatin.

## 2021-11-01 NOTE — Telephone Encounter (Signed)
Stop statin start Zetia.  This medication was called in for her.  It is not in the statin group, therefore should be well-tolerated for her.

## 2021-11-01 NOTE — Addendum Note (Signed)
Addended by: Howard Pouch A on: 11/01/2021 06:15 PM   Modules accepted: Orders

## 2021-11-02 NOTE — Telephone Encounter (Signed)
LM for pt to return call to discuss.  

## 2021-11-03 NOTE — Telephone Encounter (Signed)
Spoke with patient regarding results/recommendations.  

## 2021-11-15 DIAGNOSIS — S83242D Other tear of medial meniscus, current injury, left knee, subsequent encounter: Secondary | ICD-10-CM | POA: Diagnosis not present

## 2021-12-08 DIAGNOSIS — Z1231 Encounter for screening mammogram for malignant neoplasm of breast: Secondary | ICD-10-CM

## 2021-12-15 ENCOUNTER — Ambulatory Visit (INDEPENDENT_AMBULATORY_CARE_PROVIDER_SITE_OTHER): Payer: Medicare Other

## 2021-12-15 DIAGNOSIS — Z1231 Encounter for screening mammogram for malignant neoplasm of breast: Secondary | ICD-10-CM | POA: Diagnosis not present

## 2022-03-20 ENCOUNTER — Ambulatory Visit (INDEPENDENT_AMBULATORY_CARE_PROVIDER_SITE_OTHER): Payer: Medicare Other | Admitting: Family Medicine

## 2022-03-20 ENCOUNTER — Encounter: Payer: Self-pay | Admitting: Family Medicine

## 2022-03-20 VITALS — BP 137/88 | HR 67 | Temp 97.9°F | Ht 64.76 in | Wt 187.0 lb

## 2022-03-20 DIAGNOSIS — Z789 Other specified health status: Secondary | ICD-10-CM | POA: Diagnosis not present

## 2022-03-20 DIAGNOSIS — Z1231 Encounter for screening mammogram for malignant neoplasm of breast: Secondary | ICD-10-CM

## 2022-03-20 DIAGNOSIS — Z79899 Other long term (current) drug therapy: Secondary | ICD-10-CM

## 2022-03-20 DIAGNOSIS — M1990 Unspecified osteoarthritis, unspecified site: Secondary | ICD-10-CM

## 2022-03-20 DIAGNOSIS — E7800 Pure hypercholesterolemia, unspecified: Secondary | ICD-10-CM | POA: Diagnosis not present

## 2022-03-20 DIAGNOSIS — M4126 Other idiopathic scoliosis, lumbar region: Secondary | ICD-10-CM | POA: Diagnosis not present

## 2022-03-20 DIAGNOSIS — Z0000 Encounter for general adult medical examination without abnormal findings: Secondary | ICD-10-CM | POA: Diagnosis not present

## 2022-03-20 DIAGNOSIS — I10 Essential (primary) hypertension: Secondary | ICD-10-CM | POA: Diagnosis not present

## 2022-03-20 DIAGNOSIS — E559 Vitamin D deficiency, unspecified: Secondary | ICD-10-CM | POA: Diagnosis not present

## 2022-03-20 DIAGNOSIS — E669 Obesity, unspecified: Secondary | ICD-10-CM

## 2022-03-20 LAB — COMPREHENSIVE METABOLIC PANEL
ALT: 37 U/L — ABNORMAL HIGH (ref 0–35)
AST: 27 U/L (ref 0–37)
Albumin: 4.6 g/dL (ref 3.5–5.2)
Alkaline Phosphatase: 128 U/L — ABNORMAL HIGH (ref 39–117)
BUN: 19 mg/dL (ref 6–23)
CO2: 32 mEq/L (ref 19–32)
Calcium: 10 mg/dL (ref 8.4–10.5)
Chloride: 97 mEq/L (ref 96–112)
Creatinine, Ser: 0.85 mg/dL (ref 0.40–1.20)
GFR: 72.38 mL/min (ref >60.00)
Glucose, Bld: 97 mg/dL (ref 70–99)
Potassium: 4.4 mEq/L (ref 3.5–5.1)
Sodium: 135 mEq/L (ref 135–145)
Total Bilirubin: 0.5 mg/dL (ref 0.2–1.2)
Total Protein: 7.7 g/dL (ref 6.0–8.3)

## 2022-03-20 LAB — CBC
HCT: 40 % (ref 36.0–46.0)
Hemoglobin: 13.5 g/dL (ref 12.0–15.0)
MCHC: 33.7 g/dL (ref 30.0–36.0)
MCV: 91.5 fl (ref 78.0–100.0)
Platelets: 376 10*3/uL (ref 150.0–400.0)
RBC: 4.37 Mil/uL (ref 3.87–5.11)
RDW: 12.8 % (ref 11.5–15.5)
WBC: 5.5 10*3/uL (ref 4.0–10.5)

## 2022-03-20 LAB — HEMOGLOBIN A1C: Hgb A1c MFr Bld: 5.6 % (ref 4.6–6.5)

## 2022-03-20 LAB — TSH: TSH: 1.81 u[IU]/mL (ref 0.35–5.50)

## 2022-03-20 LAB — LIPID PANEL
Cholesterol: 253 mg/dL — ABNORMAL HIGH (ref 0–200)
HDL: 69.7 mg/dL (ref >39.00)
LDL Cholesterol: 152 mg/dL — ABNORMAL HIGH (ref 0–99)
NonHDL: 182.97
Total CHOL/HDL Ratio: 4
Triglycerides: 157 mg/dL — ABNORMAL HIGH (ref 0.0–149.0)
VLDL: 31.4 mg/dL (ref 0.0–40.0)

## 2022-03-20 LAB — VITAMIN D 25 HYDROXY (VIT D DEFICIENCY, FRACTURES): VITD: 61.63 ng/mL (ref 30.00–100.00)

## 2022-03-20 MED ORDER — HYDROCHLOROTHIAZIDE 25 MG PO TABS: 25.0000 mg | ORAL_TABLET | Freq: Every day | ORAL | 3 refills | Status: DC

## 2022-03-20 MED ORDER — DICLOFENAC SODIUM ER 100 MG PO TB24: 100.0000 mg | ORAL_TABLET | Freq: Every day | ORAL | 3 refills | Status: DC
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
## 2022-03-20 NOTE — Patient Instructions (Addendum)
Return in about 1 year (around 03/22/2023) for cpe (20 min), Routine chronic condition follow-up.        Great to see you today.  I have refilled the medication(s) we provide.   If labs were collected, we will inform you of lab results once received either by echart message or telephone call.   - echart message- for normal results that have been seen by the patient already.   - telephone call: abnormal results or if patient has not viewed results in their echart.  Health Maintenance, Female Adopting a healthy lifestyle and getting preventive care are important in promoting health and wellness. Ask your health care provider about: The right schedule for you to have regular tests and exams. Things you can do on your own to prevent diseases and keep yourself healthy. What should I know about diet, weight, and exercise? Eat a healthy diet  Eat a diet that includes plenty of vegetables, fruits, low-fat dairy products, and lean protein. Do not eat a lot of foods that are high in solid fats, added sugars, or sodium. Maintain a healthy weight Body mass index (BMI) is used to identify weight problems. It estimates body fat based on height and weight. Your health care provider can help determine your BMI and help you achieve or maintain a healthy weight. Get regular exercise Get regular exercise. This is one of the most important things you can do for your health. Most adults should: Exercise for at least 150 minutes each week. The exercise should increase your heart rate and make you sweat (moderate-intensity exercise). Do strengthening exercises at least twice a week. This is in addition to the moderate-intensity exercise. Spend less time sitting. Even light physical activity can be beneficial. Watch cholesterol and blood lipids Have your blood tested for lipids and cholesterol at 64 years of age, then have this test every 5 years. Have your cholesterol levels checked more often if: Your  lipid or cholesterol levels are high. You are older than 64 years of age. You are at high risk for heart disease. What should I know about cancer screening? Depending on your health history and family history, you may need to have cancer screening at various ages. This may include screening for: Breast cancer. Cervical cancer. Colorectal cancer. Skin cancer. Lung cancer. What should I know about heart disease, diabetes, and high blood pressure? Blood pressure and heart disease High blood pressure causes heart disease and increases the risk of stroke. This is more likely to develop in people who have high blood pressure readings or are overweight. Have your blood pressure checked: Every 3-5 years if you are 31-76 years of age. Every year if you are 39 years old or older. Diabetes Have regular diabetes screenings. This checks your fasting blood sugar level. Have the screening done: Once every three years after age 17 if you are at a normal weight and have a low risk for diabetes. More often and at a younger age if you are overweight or have a high risk for diabetes. What should I know about preventing infection? Hepatitis B If you have a higher risk for hepatitis B, you should be screened for this virus. Talk with your health care provider to find out if you are at risk for hepatitis B infection. Hepatitis C Testing is recommended for: Everyone born from [birth year] through 1963. Anyone with known risk factors for hepatitis C. Sexually transmitted infections (STIs) Get screened for STIs, including gonorrhea and chlamydia, if: You are sexually  active and are younger than 64 years of age. You are older than 64 years of age and your health care provider tells you that you are at risk for this type of infection. Your sexual activity has changed since you were last screened, and you are at increased risk for chlamydia or gonorrhea. Ask your health care provider if you are at risk. Ask your health  care provider about whether you are at high risk for HIV. Your health care provider may recommend a prescription medicine to help prevent HIV infection. If you choose to take medicine to prevent HIV, you should first get tested for HIV. You should then be tested every 3 months for as long as you are taking the medicine. Pregnancy If you are about to stop having your period (premenopausal) and you may become pregnant, seek counseling before you get pregnant. Take 400 to 800 micrograms (mcg) of folic acid every day if you become pregnant. Ask for birth control (contraception) if you want to prevent pregnancy. Osteoporosis and menopause Osteoporosis is a disease in which the bones lose minerals and strength with aging. This can result in bone fractures. If you are 80 years old or older, or if you are at risk for osteoporosis and fractures, ask your health care provider if you should: Be screened for bone loss. Take a calcium or vitamin D supplement to lower your risk of fractures. Be given hormone replacement therapy (HRT) to treat symptoms of menopause. Follow these instructions at home: Alcohol use Do not drink alcohol if: Your health care provider tells you not to drink. You are pregnant, may be pregnant, or are planning to become pregnant. If you drink alcohol: Limit how much you have to: 0-1 drink a day. Know how much alcohol is in your drink. In the [place] one drink equals one 12 oz bottle of beer (355 mL), one 5 oz glass of wine (148 mL), or one 1 oz glass of hard liquor (44 mL). Lifestyle Do not use any products that contain nicotine or tobacco. These products include cigarettes, chewing tobacco, and vaping devices, such as e-cigarettes. If you need help quitting, ask your health care provider. Do not use street drugs. Do not share needles. Ask your health care provider for help if you need support or information about quitting drugs. General instructions Schedule regular health,  dental, and eye exams. Stay current with your vaccines. Tell your health care provider if: You often feel depressed. You have ever been abused or do not feel safe at home. Summary Adopting a healthy lifestyle and getting preventive care are important in promoting health and wellness. Follow your health care provider's instructions about healthy diet, exercising, and getting tested or screened for diseases. Follow your health care provider's instructions on monitoring your cholesterol and blood pressure. This information is not intended to replace advice given to you by your health care provider. Make sure you discuss any questions you have with your health care provider. Document Revised: 08/30/2020 Document Reviewed: 08/30/2020 Elsevier Patient Education  [employer].

## 2022-06-23 LAB — HM AWV

## 2022-08-25 ENCOUNTER — Encounter: Payer: Self-pay | Admitting: Podiatry

## 2022-08-25 ENCOUNTER — Ambulatory Visit (INDEPENDENT_AMBULATORY_CARE_PROVIDER_SITE_OTHER): Payer: 59 | Admitting: Podiatry

## 2022-08-25 DIAGNOSIS — L84 Corns and callosities: Secondary | ICD-10-CM

## 2022-08-25 DIAGNOSIS — M2170 Unequal limb length (acquired), unspecified site: Secondary | ICD-10-CM | POA: Diagnosis not present

## 2022-08-25 DIAGNOSIS — M79609 Pain in unspecified limb: Secondary | ICD-10-CM

## 2022-08-25 DIAGNOSIS — B351 Tinea unguium: Secondary | ICD-10-CM

## 2022-08-25 NOTE — Progress Notes (Signed)
  Subjective:  Patient ID: Brittney Myers, female    DOB: August 18, 1955,   MRN: [id number]  Chief Complaint  Patient presents with   Callouses     Bilateral callus on going for years    Nail Problem     Routine foot care    67 y.o. female presents for concern of thickened elongated and painful nails that are difficult to trim and calluses that are painful. Requesting to have them trimmed today. She is not diabetic and not on a blood thinner. Relates a history of knee surgery a couple years ago that has left her right leg longer. Relates it is starting to affect her left hip and wondering about shoe inserts.   PCP:  Claiborne Billings, Renee A, DO    . Denies any other pedal complaints. Denies n/v/f/c.   Past Medical History:  Diagnosis Date   Arthritis    Colon polyp 2014   Russella Dar)   COPD with emphysema (HCC)    beginnings   DDD (degenerative disc disease), lumbar    Emphysema    spirometry 04/2010 - FVC 66%, FEV1 53%, ratio 0.63 moderately severe obstruction   Emphysema of lung (HCC)    Former smoker    Hypertension    Inflammatory polyarthritis (HCC)    Raynaud's disease    hands   Scleroderma (HCC) 04/2010   Hawkes, low SSA, ANA +   Scoliosis     Objective:  Physical Exam: Vascular: DP/PT pulses 2/4 bilateral. CFT <3 seconds. Normal hair growth on digits. No edema.  Skin. No lacerations or abrasions bilateral feet. Nails 2-5 bilateral thickened elongated and dystrophic with subungual debris. Hyerpkeratotic cored lesion noted sub second and fifth metatarsals on right and sub fifth metatarsal on left.  Musculoskeletal: MMT 5/5 bilateral lower extremities in DF, PF, Inversion and Eversion. Deceased ROM in DF of ankle joint.  Neurological: Sensation intact to light touch.   Assessment:   1. Pain due to onychomycosis of nail   2. Callus of foot   3. Acquired unequal limb length      Plan:  Patient was evaluated and treated and all questions answered. -Mechanically debrided all nails  1-5 bilateral using sterile nail nipper and filed with dremel without incident  -Hyperkeratotic tissue debrided without incident with #15 blade.  Discussed limb length discrepancy and provided heel lifts today. Discussed possible shoe lift in future.  -Answered all patient questions -Patient to return  in 3 months for at risk foot care -Patient advised to call the office if any problems or questions arise in the meantime.   Louann Sjogren, DPM

## 2022-08-29 ENCOUNTER — Telehealth: Payer: Self-pay | Admitting: Family Medicine

## 2022-08-29 NOTE — Telephone Encounter (Signed)
Contacted Brittney Myers to schedule their annual wellness visit. Patient declined to schedule AWV at this time.  Patient states she had her AWVS with nurse in home with [facility] on 06/25/2022.  Gabriel Cirri [facility] AWV TEAM Direct Dial [phone]

## 2022-10-23 ENCOUNTER — Encounter: Payer: Self-pay | Admitting: Gastroenterology

## 2022-12-19 ENCOUNTER — Ambulatory Visit (AMBULATORY_SURGERY_CENTER): Payer: 59

## 2022-12-19 ENCOUNTER — Encounter: Payer: Self-pay | Admitting: Gastroenterology

## 2022-12-19 VITALS — Ht 64.0 in | Wt 175.0 lb

## 2022-12-19 DIAGNOSIS — Z86010 Personal history of colonic polyps: Secondary | ICD-10-CM

## 2022-12-19 MED ORDER — NA SULFATE-K SULFATE-MG SULF 17.5-3.13-1.6 GM/177ML PO SOLN: 1.0000 | Freq: Once | ORAL | 0 refills | Status: AC

## 2022-12-19 NOTE — Progress Notes (Signed)
No egg or soy allergy known to patient  No issues known to pt with past sedation with any surgeries or procedures Patient denies ever being told they had issues or difficulty with intubation  No FH of Malignant Hyperthermia Pt is not on diet pills Pt is not on  home 02  Pt is not on blood thinners  Pt denies issues with constipation in the last few months due to some diet changes  No A fib or A flutter Have any cardiac testing pending--no  LOA: independent  Prep: suprep   Patient's chart reviewed by Cathlyn Parsons CNRA prior to previsit and patient appropriate for the [facility].  Previsit completed and red dot placed by patient's name on their procedure day (on provider's schedule).     PV competed with patient. Prep instructions sent via mychart and home address. Goodrx coupon for [facility] provided to use for price reduction if needed.

## 2022-12-20 ENCOUNTER — Ambulatory Visit: Payer: 59

## 2022-12-20 DIAGNOSIS — Z1231 Encounter for screening mammogram for malignant neoplasm of breast: Secondary | ICD-10-CM | POA: Diagnosis not present

## 2022-12-27 ENCOUNTER — Telehealth: Payer: Self-pay | Admitting: Family Medicine

## 2022-12-27 NOTE — Telephone Encounter (Signed)
Pt made aware

## 2022-12-27 NOTE — Telephone Encounter (Signed)
Patient called to inquire if the RSV shot is recommended for her to get again this year or if this is something she should get every year? Please give the patient a call to advise.

## 2023-01-08 ENCOUNTER — Encounter: Payer: Self-pay | Admitting: Gastroenterology

## 2023-01-08 ENCOUNTER — Ambulatory Visit (AMBULATORY_SURGERY_CENTER): Payer: 59 | Admitting: Gastroenterology

## 2023-01-08 VITALS — BP 145/67 | HR 56 | Temp 97.5°F | Resp 12 | Ht 64.0 in | Wt 175.0 lb

## 2023-01-08 DIAGNOSIS — D125 Benign neoplasm of sigmoid colon: Secondary | ICD-10-CM | POA: Diagnosis not present

## 2023-01-08 DIAGNOSIS — D122 Benign neoplasm of ascending colon: Secondary | ICD-10-CM

## 2023-01-08 DIAGNOSIS — Z86010 Personal history of colonic polyps: Secondary | ICD-10-CM

## 2023-01-08 DIAGNOSIS — I10 Essential (primary) hypertension: Secondary | ICD-10-CM | POA: Diagnosis not present

## 2023-01-08 DIAGNOSIS — Z09 Encounter for follow-up examination after completed treatment for conditions other than malignant neoplasm: Secondary | ICD-10-CM | POA: Diagnosis not present

## 2023-01-08 DIAGNOSIS — K635 Polyp of colon: Secondary | ICD-10-CM | POA: Diagnosis not present

## 2023-01-08 MED ORDER — SODIUM CHLORIDE 0.9 % IV SOLN: 500.0000 mL | Freq: Once | INTRAVENOUS | Status: DC

## 2023-01-08 NOTE — Progress Notes (Signed)
To pacu, VSS. Report to [email or web address] 
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
## 2023-01-08 NOTE — Progress Notes (Signed)
I have reviewed the patient's medical history in detail and updated the computerized patient record.

## 2023-01-08 NOTE — Op Note (Signed)
[facility] Patient Name: Brittney Myers Procedure Date: 01/08/2023 10:45 AM MRN: [id number] Endoscopist: Meryl Dare , MD, [id number] Age: 66 Referring MD:  Date of Birth: Jul 14, 1955 Gender: Female Account #: [id number] Procedure:                Colonoscopy Indications:              High risk colon cancer surveillance: Personal                            history of traditional serrated adenoma of the colon Medicines:                Monitored Anesthesia Care Procedure:                Pre-Anesthesia Assessment:                           - Prior to the procedure, a History and Physical                            was performed, and patient medications and                            allergies were reviewed. The patient's tolerance of                            previous anesthesia was also reviewed. The risks                            and benefits of the procedure and the sedation                            options and risks were discussed with the patient.                            All questions were answered, and informed consent                            was obtained. Prior Anticoagulants: The patient has                            taken no anticoagulant or antiplatelet agents. ASA                            Grade Assessment: II - A patient with mild systemic                            disease. After reviewing the risks and benefits,                            the patient was deemed in satisfactory condition to                            undergo the procedure.  After obtaining informed consent, the colonoscope                            was passed under direct vision. Throughout the                            procedure, the patient's blood pressure, pulse, and                            oxygen saturations were monitored continuously. The                            CF [id number] #[id number] was introduced through the anus                            and  advanced to the the cecum, identified by                            appendiceal orifice and ileocecal valve. The                            ileocecal valve, appendiceal orifice, and rectum                            were photographed. The quality of the bowel                            preparation was good. The colonoscopy was performed                            without difficulty. The patient tolerated the                            procedure well. Scope In: 10:55:48 AM Scope Out: 11:11:39 AM Scope Withdrawal Time: 0 hours 12 minutes 5 seconds  Total Procedure Duration: 0 hours 15 minutes 51 seconds  Findings:                 The perianal and digital rectal examinations were                            normal.                           A 2 mm polyp was found in the ascending colon. The                            polyp was sessile. The polyp was removed with a                            cold biopsy forceps. Resection and retrieval were                            complete.  Two sessile polyps were found in the sigmoid colon.                            The polyps were 6 to 7 mm in size. These polyps                            were removed with a cold snare. Resection and                            retrieval were complete.                           Multiple small-mouthed diverticula were found in                            the left colon. There was no evidence of                            diverticular bleeding.                           The exam was otherwise without abnormality on                            direct and retroflexion views. Complications:            No immediate complications. Estimated blood loss:                            None. Estimated Blood Loss:     Estimated blood loss: none. Impression:               - One 2 mm polyp in the ascending colon, removed                            with a cold biopsy forceps. Resected and retrieved.                            - Two 6 to 7 mm polyps in the sigmoid colon,                            removed with a cold snare. Resected and retrieved.                           - Mild diverticulosis in the left colon.                           - The examination was otherwise normal on direct                            and retroflexion views. Recommendation:           - Repeat colonoscopy after studies are complete for  surveillance based on pathology results.                           - Patient has a contact number available for                            emergencies. The signs and symptoms of potential                            delayed complications were discussed with the                            patient. Return to normal activities tomorrow.                            Written discharge instructions were provided to the                            patient.                           - Resume previous diet plus high fiber.                           - Continue present medications.                           - Await pathology results. Meryl Dare, MD 01/08/2023 11:16:10 AM This report has been signed electronically.

## 2023-01-08 NOTE — Patient Instructions (Signed)
Handouts provided on polyps and diverticulosis.  Resume previous diet plus high fiber diet (see handout).  Continue present medications.  Await pathology results.   YOU HAD AN ENDOSCOPIC PROCEDURE TODAY AT THE [facility]:   Refer to the procedure report that was given to you for any specific questions about what was found during the examination.  If the procedure report does not answer your questions, please call your gastroenterologist to clarify.  If you requested that your care partner not be given the details of your procedure findings, then the procedure report has been included in a sealed envelope for you to review at your convenience later.  YOU SHOULD EXPECT: Some feelings of bloating in the abdomen. Passage of more gas than usual.  Walking can help get rid of the air that was put into your GI tract during the procedure and reduce the bloating. If you had a lower endoscopy (such as a colonoscopy or flexible sigmoidoscopy) you may notice spotting of blood in your stool or on the toilet paper. If you underwent a bowel prep for your procedure, you may not have a normal bowel movement for a few days.  Please Note:  You might notice some irritation and congestion in your nose or some drainage.  This is from the oxygen used during your procedure.  There is no need for concern and it should clear up in a day or so.  SYMPTOMS TO REPORT IMMEDIATELY:  Following lower endoscopy (colonoscopy or flexible sigmoidoscopy):  Excessive amounts of blood in the stool  Significant tenderness or worsening of abdominal pains  Swelling of the abdomen that is new, acute  Fever of 100F or higher  For urgent or emergent issues, a gastroenterologist can be reached at any hour by calling [phone]. Do not use MyChart messaging for urgent concerns.    DIET:  We do recommend a small meal at first, but then you may proceed to your regular diet.  Drink plenty of fluids but you should avoid  alcoholic beverages for 24 hours.  ACTIVITY:  You should plan to take it easy for the rest of today and you should NOT DRIVE or use heavy machinery until tomorrow (because of the sedation medicines used during the test).    FOLLOW UP: Our staff will call the number listed on your records the next business day following your procedure.  We will call around 7:15- 8:00 am to check on you and address any questions or concerns that you may have regarding the information given to you following your procedure. If we do not reach you, we will leave a message.     If any biopsies were taken you will be contacted by phone or by letter within the next 1-3 weeks.  Please call us at [phone] if you have not heard about the biopsies in 3 weeks.    SIGNATURES/CONFIDENTIALITY: You and/or your care partner have signed paperwork which will be entered into your electronic medical record.  These signatures attest to the fact that that the information above on your After Visit Summary has been reviewed and is understood.  Full responsibility of the confidentiality of this discharge information lies with you and/or your care-partner.

## 2023-01-08 NOTE — Progress Notes (Signed)
Called to room to assist during endoscopic procedure.  Patient ID and intended procedure confirmed with present staff. Received instructions for my participation in the procedure from the performing physician.  

## 2023-01-09 ENCOUNTER — Telehealth: Payer: Self-pay | Admitting: *Deleted

## 2023-01-09 NOTE — Telephone Encounter (Signed)
  Follow up Call-     01/08/2023   10:10 AM  Call back number  Post procedure Call Back phone  # [phone]  Permission to leave phone message Yes     Patient questions:  Do you have a fever, pain , or abdominal swelling? No. Pain Score  0 *  Have you tolerated food without any problems? Yes.    Have you been able to return to your normal activities? Yes.    Do you have any questions about your discharge instructions: Diet   No. Medications  No. Follow up visit  No.  Do you have questions or concerns about your Care? No.  Actions: * If pain score is 4 or above: No action needed, pain <4.

## 2023-01-23 ENCOUNTER — Encounter: Payer: Self-pay | Admitting: Gastroenterology

## 2023-02-06 ENCOUNTER — Telehealth: Payer: Self-pay | Admitting: Family Medicine

## 2023-02-06 NOTE — Telephone Encounter (Signed)
Patient wanted to make sure you were notified of her updated COVID vaccine and Flu vaccine she had done on 01/15/2023.    Thank you  Gabriel Cirri [facility] AWV TEAM Direct Dial [phone]

## 2023-02-06 NOTE — Telephone Encounter (Signed)
updated

## 2023-03-11 ENCOUNTER — Other Ambulatory Visit: Payer: Self-pay | Admitting: Family Medicine

## 2023-03-11 DIAGNOSIS — I10 Essential (primary) hypertension: Secondary | ICD-10-CM

## 2023-03-27 ENCOUNTER — Encounter: Payer: Self-pay | Admitting: Family Medicine

## 2023-03-27 ENCOUNTER — Ambulatory Visit: Payer: 59 | Admitting: Family Medicine

## 2023-03-27 VITALS — BP 138/78 | HR 75 | Temp 97.6°F | Ht 64.37 in | Wt 176.6 lb

## 2023-03-27 DIAGNOSIS — Z131 Encounter for screening for diabetes mellitus: Secondary | ICD-10-CM

## 2023-03-27 DIAGNOSIS — E559 Vitamin D deficiency, unspecified: Secondary | ICD-10-CM | POA: Diagnosis not present

## 2023-03-27 DIAGNOSIS — Z87891 Personal history of nicotine dependence: Secondary | ICD-10-CM | POA: Diagnosis not present

## 2023-03-27 DIAGNOSIS — E669 Obesity, unspecified: Secondary | ICD-10-CM

## 2023-03-27 DIAGNOSIS — Z1231 Encounter for screening mammogram for malignant neoplasm of breast: Secondary | ICD-10-CM | POA: Diagnosis not present

## 2023-03-27 DIAGNOSIS — I7300 Raynaud's syndrome without gangrene: Secondary | ICD-10-CM

## 2023-03-27 DIAGNOSIS — E782 Mixed hyperlipidemia: Secondary | ICD-10-CM

## 2023-03-27 DIAGNOSIS — M1990 Unspecified osteoarthritis, unspecified site: Secondary | ICD-10-CM

## 2023-03-27 DIAGNOSIS — Z0000 Encounter for general adult medical examination without abnormal findings: Secondary | ICD-10-CM

## 2023-03-27 DIAGNOSIS — Z789 Other specified health status: Secondary | ICD-10-CM

## 2023-03-27 DIAGNOSIS — M349 Systemic sclerosis, unspecified: Secondary | ICD-10-CM

## 2023-03-27 DIAGNOSIS — I10 Essential (primary) hypertension: Secondary | ICD-10-CM | POA: Diagnosis not present

## 2023-03-27 DIAGNOSIS — M4126 Other idiopathic scoliosis, lumbar region: Secondary | ICD-10-CM

## 2023-03-27 LAB — COMPREHENSIVE METABOLIC PANEL
ALT: 26 U/L (ref 0–35)
AST: 27 U/L (ref 0–37)
Albumin: 4.6 g/dL (ref 3.5–5.2)
Alkaline Phosphatase: 120 U/L — ABNORMAL HIGH (ref 39–117)
BUN: 21 mg/dL (ref 6–23)
CO2: 30 meq/L (ref 19–32)
Calcium: 10.1 mg/dL (ref 8.4–10.5)
Chloride: 100 meq/L (ref 96–112)
Creatinine, Ser: 0.86 mg/dL (ref 0.40–1.20)
GFR: 70.86 mL/min (ref >60.00)
Glucose, Bld: 97 mg/dL (ref 70–99)
Potassium: 4.5 meq/L (ref 3.5–5.1)
Sodium: 139 meq/L (ref 135–145)
Total Bilirubin: 0.5 mg/dL (ref 0.2–1.2)
Total Protein: 7.9 g/dL (ref 6.0–8.3)

## 2023-03-27 LAB — LIPID PANEL
Cholesterol: 241 mg/dL — ABNORMAL HIGH (ref 0–200)
HDL: 60.7 mg/dL (ref >39.00)
LDL Cholesterol: 154 mg/dL — ABNORMAL HIGH (ref 0–99)
NonHDL: 179.8
Total CHOL/HDL Ratio: 4
Triglycerides: 129 mg/dL (ref 0.0–149.0)
VLDL: 25.8 mg/dL (ref 0.0–40.0)

## 2023-03-27 LAB — CBC
HCT: 42.3 % (ref 36.0–46.0)
Hemoglobin: 14.1 g/dL (ref 12.0–15.0)
MCHC: 33.3 g/dL (ref 30.0–36.0)
MCV: 92 fL (ref 78.0–100.0)
Platelets: 339 10*3/uL (ref 150.0–400.0)
RBC: 4.6 Mil/uL (ref 3.87–5.11)
RDW: 13 % (ref 11.5–15.5)
WBC: 5.5 10*3/uL (ref 4.0–10.5)

## 2023-03-27 LAB — TSH: TSH: 1.46 u[IU]/mL (ref 0.35–5.50)

## 2023-03-27 LAB — HEMOGLOBIN A1C: Hgb A1c MFr Bld: 5.6 % (ref 4.6–6.5)

## 2023-03-27 LAB — VITAMIN D 25 HYDROXY (VIT D DEFICIENCY, FRACTURES): VITD: 44.02 ng/mL (ref 30.00–100.00)

## 2023-03-27 MED ORDER — DICLOFENAC SODIUM ER 100 MG PO TB24: 100.0000 mg | ORAL_TABLET | Freq: Every day | ORAL | 3 refills | Status: DC

## 2023-03-27 MED ORDER — HYDROCHLOROTHIAZIDE 25 MG PO TABS: 25.0000 mg | ORAL_TABLET | Freq: Every day | ORAL | 3 refills | Status: DC

## 2023-03-27 NOTE — Patient Instructions (Signed)
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
## 2023-03-30 DIAGNOSIS — Z87891 Personal history of nicotine dependence: Secondary | ICD-10-CM | POA: Insufficient documentation

## 2023-04-05 ENCOUNTER — Ambulatory Visit: Payer: 59

## 2023-04-05 DIAGNOSIS — J439 Emphysema, unspecified: Secondary | ICD-10-CM

## 2023-04-05 DIAGNOSIS — Z87891 Personal history of nicotine dependence: Secondary | ICD-10-CM

## 2023-04-05 DIAGNOSIS — I2583 Coronary atherosclerosis due to lipid rich plaque: Secondary | ICD-10-CM

## 2023-04-05 DIAGNOSIS — Z122 Encounter for screening for malignant neoplasm of respiratory organs: Secondary | ICD-10-CM

## 2023-04-16 ENCOUNTER — Other Ambulatory Visit: Payer: Self-pay | Admitting: Family Medicine

## 2023-04-16 ENCOUNTER — Encounter: Payer: Self-pay | Admitting: Family Medicine

## 2023-04-16 DIAGNOSIS — I2510 Atherosclerotic heart disease of native coronary artery without angina pectoris: Secondary | ICD-10-CM | POA: Insufficient documentation

## 2023-04-16 DIAGNOSIS — I700 Atherosclerosis of aorta: Secondary | ICD-10-CM | POA: Insufficient documentation

## 2023-04-16 MED ORDER — PRAVASTATIN SODIUM 10 MG PO TABS: 10.0000 mg | ORAL_TABLET | Freq: Every evening | ORAL | 3 refills | Status: DC | PRN

## 2023-05-06 ENCOUNTER — Other Ambulatory Visit: Payer: Self-pay | Admitting: Family Medicine

## 2023-07-25 ENCOUNTER — Ambulatory Visit (INDEPENDENT_AMBULATORY_CARE_PROVIDER_SITE_OTHER): Admitting: *Deleted

## 2023-07-25 DIAGNOSIS — Z0000 Encounter for general adult medical examination without abnormal findings: Secondary | ICD-10-CM
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
## 2023-07-25 NOTE — Patient Instructions (Signed)
 Ms. Palmatier , Thank you for taking time to come for your Medicare Wellness Visit. I appreciate your ongoing commitment to your health goals. Please review the following plan we discussed and let me know if I can assist you in the future.   Screening recommendations/referrals: Colonoscopy: up to date Mammogram: up to date Bone Density: up to date Recommended yearly ophthalmology/optometry visit for glaucoma screening and checkup Recommended yearly dental visit for hygiene and checkup  Vaccinations: Influenza vaccine: up to date Pneumococcal vaccine: up to date Tdap vaccine: up to date Shingles vaccine: up to date      Preventive Care 65 Years and Older, Female Preventive care refers to lifestyle choices and visits with your health care provider that can promote health and wellness. What does preventive care include? A yearly physical exam. This is also called an annual well check. Dental exams once or twice a year. Routine eye exams. Ask your health care provider how often you should have your eyes checked. Personal lifestyle choices, including: Daily care of your teeth and gums. Regular physical activity. Eating a healthy diet. Avoiding tobacco and drug use. Limiting alcohol use. Practicing safe sex. Taking low-dose aspirin every day. Taking vitamin and mineral supplements as recommended by your health care provider. What happens during an annual well check? The services and screenings done by your health care provider during your annual well check will depend on your age, overall health, lifestyle risk factors, and family history of disease. Counseling  Your health care provider may ask you questions about your: Alcohol use. Tobacco use. Drug use. Emotional well-being. Home and relationship well-being. Sexual activity. Eating habits. History of falls. Memory and ability to understand (cognition). Work and work Astronomer. Reproductive health. Screening  You may have  the following tests or measurements: Height, weight, and BMI. Blood pressure. Lipid and cholesterol levels. These may be checked every 5 years, or more frequently if you are over 31 years old. Skin check. Lung cancer screening. You may have this screening every year starting at age 2 if you have a 30-pack-year history of smoking and currently smoke or have quit within the past 15 years. Fecal occult blood test (FOBT) of the stool. You may have this test every year starting at age 40. Flexible sigmoidoscopy or colonoscopy. You may have a sigmoidoscopy every 5 years or a colonoscopy every 10 years starting at age 70. Hepatitis C blood test. Hepatitis B blood test. Sexually transmitted disease (STD) testing. Diabetes screening. This is done by checking your blood sugar (glucose) after you have not eaten for a while (fasting). You may have this done every 1-3 years. Bone density scan. This is done to screen for osteoporosis. You may have this done starting at age 26. Mammogram. This may be done every 1-2 years. Talk to your health care provider about how often you should have regular mammograms. Talk with your health care provider about your test results, treatment options, and if necessary, the need for more tests. Vaccines  Your health care provider may recommend certain vaccines, such as: Influenza vaccine. This is recommended every year. Tetanus, diphtheria, and acellular pertussis (Tdap, Td) vaccine. You may need a Td booster every 10 years. Zoster vaccine. You may need this after age 8. Pneumococcal 13-valent conjugate (PCV13) vaccine. One dose is recommended after age 57. Pneumococcal polysaccharide (PPSV23) vaccine. One dose is recommended after age 25. Talk to your health care provider about which screenings and vaccines you need and how often you need them. This  information is not intended to replace advice given to you by your health care provider. Make sure you discuss any questions  you have with your health care provider. Document Released: 05/07/2015 Document Revised: 12/29/2015 Document Reviewed: 02/09/2015 Elsevier Interactive Patient Education  2017 [employer].  Fall Prevention in the Home Falls can cause injuries. They can happen to people of all ages. There are many things you can do to make your home safe and to help prevent falls. What can I do on the outside of my home? Regularly fix the edges of walkways and driveways and fix any cracks. Remove anything that might make you trip as you walk through a door, such as a raised step or threshold. Trim any bushes or trees on the path to your home. Use bright outdoor lighting. Clear any walking paths of anything that might make someone trip, such as rocks or tools. Regularly check to see if handrails are loose or broken. Make sure that both sides of any steps have handrails. Any raised decks and porches should have guardrails on the edges. Have any leaves, snow, or ice cleared regularly. Use sand or salt on walking paths during winter. Clean up any spills in your garage right away. This includes oil or grease spills. What can I do in the bathroom? Use night lights. Install grab bars by the toilet and in the tub and shower. Do not use towel bars as grab bars. Use non-skid mats or decals in the tub or shower. If you need to sit down in the shower, use a plastic, non-slip stool. Keep the floor dry. Clean up any water that spills on the floor as soon as it happens. Remove soap buildup in the tub or shower regularly. Attach bath mats securely with double-sided non-slip rug tape. Do not have throw rugs and other things on the floor that can make you trip. What can I do in the bedroom? Use night lights. Make sure that you have a light by your bed that is easy to reach. Do not use any sheets or blankets that are too big for your bed. They should not hang down onto the floor. Have a firm chair that has side arms. You  can use this for support while you get dressed. Do not have throw rugs and other things on the floor that can make you trip. What can I do in the kitchen? Clean up any spills right away. Avoid walking on wet floors. Keep items that you use a lot in easy-to-reach places. If you need to reach something above you, use a strong step stool that has a grab bar. Keep electrical cords out of the way. Do not use floor polish or wax that makes floors slippery. If you must use wax, use non-skid floor wax. Do not have throw rugs and other things on the floor that can make you trip. What can I do with my stairs? Do not leave any items on the stairs. Make sure that there are handrails on both sides of the stairs and use them. Fix handrails that are broken or loose. Make sure that handrails are as long as the stairways. Check any carpeting to make sure that it is firmly attached to the stairs. Fix any carpet that is loose or worn. Avoid having throw rugs at the top or bottom of the stairs. If you do have throw rugs, attach them to the floor with carpet tape. Make sure that you have a light switch at the top  of the stairs and the bottom of the stairs. If you do not have them, ask someone to add them for you. What else can I do to help prevent falls? Wear shoes that: Do not have high heels. Have rubber bottoms. Are comfortable and fit you well. Are closed at the toe. Do not wear sandals. If you use a stepladder: Make sure that it is fully opened. Do not climb a closed stepladder. Make sure that both sides of the stepladder are locked into place. Ask someone to hold it for you, if possible. Clearly mark and make sure that you can see: Any grab bars or handrails. First and last steps. Where the edge of each step is. Use tools that help you move around (mobility aids) if they are needed. These include: Canes. Walkers. Scooters. Crutches. Turn on the lights when you go into a dark area. Replace any  light bulbs as soon as they burn out. Set up your furniture so you have a clear path. Avoid moving your furniture around. If any of your floors are uneven, fix them. If there are any pets around you, be aware of where they are. Review your medicines with your doctor. Some medicines can make you feel dizzy. This can increase your chance of falling. Ask your doctor what other things that you can do to help prevent falls. This information is not intended to replace advice given to you by your health care provider. Make sure you discuss any questions you have with your health care provider. Document Released: 02/04/2009 Document Revised: 09/16/2015 Document Reviewed: 05/15/2014 Elsevier Interactive Patient Education  2017 [employer].

## 2023-08-22 ENCOUNTER — Telehealth: Payer: Self-pay

## 2023-08-22 NOTE — Telephone Encounter (Signed)
 Please call patient It is too early to place the repeat lung cancer screening she is referring to. We order her lung cancer screening next week her CPE in December.  There is documentation that has to be attached to those orders when patients have to be counseled, in order for insurance to continue to cover it.  Therefore we will order it for her during physical in December.

## 2023-08-22 NOTE — Telephone Encounter (Signed)
 Copied from CRM [id number]. Topic: Clinical - Medical Advice >> Aug 22, 2023 11:52 AM Palma Bob wrote: Reason for CRM: Patient states she wants to speak with Dr.Kuneff, advise she was trying to schedule all of her appts for this year but she still has not received the referral/order for the lung cancer screening. She stated she spoke with Imaging but she will need order from Doctor.    Please advise. Last one completed Dec 2024 with CPE

## 2023-12-26 ENCOUNTER — Ambulatory Visit

## 2023-12-31 ENCOUNTER — Telehealth: Payer: Self-pay

## 2023-12-31 NOTE — Telephone Encounter (Signed)
 Reason for CRM: Patient just wanted to note that she had her flu shot done today  and would like to have it charted. She will also have her covid shot done.  Patient also stated that Mammograms appts are out to March due to lab tech leaving, and is wondering if she could/should do it sooner.  [phone] (M)

## 2024-01-02 ENCOUNTER — Telehealth: Payer: Self-pay

## 2024-01-02 MED ORDER — MNEXSPIKE 10 MCG/0.2ML IM SUSY: 10.0000 ug | PREFILLED_SYRINGE | Freq: Once | INTRAMUSCULAR | 0 refills | Status: AC

## 2024-01-02 NOTE — Telephone Encounter (Signed)
 Communication  Reason for CRM: Patient called in stated the [facility] is not carrying the prescription  COVID-19 mRNA Vacc, Moderna, (MNEXSPIKE) 10 MCG/0.2ML SUSY    Store [phone] needs it sent to        [facility] at          [address]    [place], [place] [address]    [address]    [phone]   Request for covid vax previously sent to provider.

## 2024-01-02 NOTE — Telephone Encounter (Signed)
 Please inform patient I have called in the COVID-19 vaccine for her to her pharmacy. As far as the mammogram, her mammogram was due in August, therefore waiting till March would be pretty far off. This decision is hers to make on whether to wait or not, but if she is willing to have it up at a different location and they are able to get her in sooner, we can certainly switch the location.  We could also sign her up for her next mammogram bus which I believe this next month.

## 2024-01-03 NOTE — Telephone Encounter (Signed)
 No further action needed at this time.

## 2024-02-18 ENCOUNTER — Ambulatory Visit
Admission: RE | Admit: 2024-02-18 | Discharge: 2024-02-18 | Disposition: A | Source: Ambulatory Visit | Attending: Family Medicine | Admitting: Family Medicine

## 2024-02-18 DIAGNOSIS — Z1231 Encounter for screening mammogram for malignant neoplasm of breast: Secondary | ICD-10-CM | POA: Diagnosis not present

## 2024-02-21 ENCOUNTER — Ambulatory Visit: Payer: Self-pay | Admitting: Family Medicine

## 2024-03-27 ENCOUNTER — Encounter: Payer: Self-pay | Admitting: Family Medicine

## 2024-03-27 ENCOUNTER — Ambulatory Visit: Admitting: Family Medicine

## 2024-03-27 VITALS — BP 136/80 | HR 72 | Temp 98.1°F | Ht 64.0 in | Wt 179.2 lb

## 2024-03-27 DIAGNOSIS — I2510 Atherosclerotic heart disease of native coronary artery without angina pectoris: Secondary | ICD-10-CM

## 2024-03-27 DIAGNOSIS — M4126 Other idiopathic scoliosis, lumbar region: Secondary | ICD-10-CM

## 2024-03-27 DIAGNOSIS — M1990 Unspecified osteoarthritis, unspecified site: Secondary | ICD-10-CM

## 2024-03-27 DIAGNOSIS — E559 Vitamin D deficiency, unspecified: Secondary | ICD-10-CM

## 2024-03-27 DIAGNOSIS — E782 Mixed hyperlipidemia: Secondary | ICD-10-CM

## 2024-03-27 DIAGNOSIS — I7300 Raynaud's syndrome without gangrene: Secondary | ICD-10-CM

## 2024-03-27 DIAGNOSIS — E669 Obesity, unspecified: Secondary | ICD-10-CM

## 2024-03-27 DIAGNOSIS — Z122 Encounter for screening for malignant neoplasm of respiratory organs: Secondary | ICD-10-CM

## 2024-03-27 DIAGNOSIS — I700 Atherosclerosis of aorta: Secondary | ICD-10-CM

## 2024-03-27 DIAGNOSIS — G8929 Other chronic pain: Secondary | ICD-10-CM

## 2024-03-27 DIAGNOSIS — M349 Systemic sclerosis, unspecified: Secondary | ICD-10-CM

## 2024-03-27 DIAGNOSIS — N62 Hypertrophy of breast: Secondary | ICD-10-CM

## 2024-03-27 DIAGNOSIS — Z87891 Personal history of nicotine dependence: Secondary | ICD-10-CM

## 2024-03-27 DIAGNOSIS — E2839 Other primary ovarian failure: Secondary | ICD-10-CM

## 2024-03-27 DIAGNOSIS — Z0000 Encounter for general adult medical examination without abnormal findings: Secondary | ICD-10-CM

## 2024-03-27 DIAGNOSIS — J439 Emphysema, unspecified: Secondary | ICD-10-CM

## 2024-03-27 DIAGNOSIS — Z1231 Encounter for screening mammogram for malignant neoplasm of breast: Secondary | ICD-10-CM

## 2024-03-27 DIAGNOSIS — I10 Essential (primary) hypertension: Secondary | ICD-10-CM

## 2024-03-27 DIAGNOSIS — Z131 Encounter for screening for diabetes mellitus: Secondary | ICD-10-CM

## 2024-03-27 DIAGNOSIS — T466X5A Adverse effect of antihyperlipidemic and antiarteriosclerotic drugs, initial encounter: Secondary | ICD-10-CM | POA: Insufficient documentation

## 2024-03-27 LAB — LIPID PANEL
Cholesterol: 225 mg/dL — ABNORMAL HIGH (ref 0–200)
HDL: 61.5 mg/dL (ref >39.00)
LDL Cholesterol: 139 mg/dL — ABNORMAL HIGH (ref 0–99)
NonHDL: 163.88
Total CHOL/HDL Ratio: 4
Triglycerides: 126 mg/dL (ref 0.0–149.0)
VLDL: 25.2 mg/dL (ref 0.0–40.0)

## 2024-03-27 LAB — CBC
HCT: 40.4 % (ref 36.0–46.0)
Hemoglobin: 13.9 g/dL (ref 12.0–15.0)
MCHC: 34.3 g/dL (ref 30.0–36.0)
MCV: 89.9 fl (ref 78.0–100.0)
Platelets: 325 K/uL (ref 150.0–400.0)
RBC: 4.5 Mil/uL (ref 3.87–5.11)
RDW: 12.9 % (ref 11.5–15.5)
WBC: 5.8 K/uL (ref 4.0–10.5)

## 2024-03-27 LAB — COMPREHENSIVE METABOLIC PANEL WITH GFR
ALT: 22 U/L (ref 0–35)
AST: 23 U/L (ref 0–37)
Albumin: 4.6 g/dL (ref 3.5–5.2)
Alkaline Phosphatase: 104 U/L (ref 39–117)
BUN: 19 mg/dL (ref 6–23)
CO2: 32 meq/L (ref 19–32)
Calcium: 10 mg/dL (ref 8.4–10.5)
Chloride: 100 meq/L (ref 96–112)
Creatinine, Ser: 0.82 mg/dL (ref 0.40–1.20)
GFR: 74.5 mL/min (ref >60.00)
Glucose, Bld: 95 mg/dL (ref 70–99)
Potassium: 4.6 meq/L (ref 3.5–5.1)
Sodium: 141 meq/L (ref 135–145)
Total Bilirubin: 0.5 mg/dL (ref 0.2–1.2)
Total Protein: 7.5 g/dL (ref 6.0–8.3)

## 2024-03-27 LAB — TSH: TSH: 2.08 u[IU]/mL (ref 0.35–5.50)

## 2024-03-27 LAB — HEMOGLOBIN A1C: Hgb A1c MFr Bld: 5.3 % (ref 4.6–6.5)

## 2024-03-27 LAB — VITAMIN D 25 HYDROXY (VIT D DEFICIENCY, FRACTURES): VITD: 35.38 ng/mL (ref 30.00–100.00)

## 2024-03-27 MED ORDER — DICLOFENAC SODIUM ER 100 MG PO TB24: 100.0000 mg | ORAL_TABLET | Freq: Every day | ORAL | 3 refills | Status: AC

## 2024-03-27 MED ORDER — AMLODIPINE BESYLATE 2.5 MG PO TABS: 2.5000 mg | ORAL_TABLET | Freq: Every day | ORAL | 0 refills | Status: AC

## 2024-03-27 MED ORDER — HYDROCHLOROTHIAZIDE 25 MG PO TABS: 25.0000 mg | ORAL_TABLET | Freq: Every day | ORAL | 3 refills | Status: AC
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
## 2024-03-27 NOTE — Patient Instructions (Addendum)
 Return in about 1 year (around 03/28/2025) for cpe (20 min), Routine chronic condition follow-up.  4 weeks provider visit for HTN recheck      Great to see you today.  I have refilled the medication(s) we provide.   If labs were collected or images ordered, we will inform you of  results once we have received them and reviewed. We will contact you either by echart message, or telephone call.  Please give ample time to the testing facility, and our office to run,  receive and review results. Please do not call inquiring of results, even if you can see them in your chart. We will contact you as soon as we are able. If it has been over 1 week since the test was completed, and you have not yet heard from us , then please call us .    - echart message- for normal results that have been seen by the patient already.   - telephone call: abnormal results or if patient has not viewed results in their echart.  If a referral to a specialist was entered for you, please call us  in 2 weeks if you have not heard from the specialist office to schedule.

## 2024-03-28 ENCOUNTER — Ambulatory Visit: Payer: Self-pay | Admitting: Family Medicine

## 2024-04-03 ENCOUNTER — Ambulatory Visit

## 2024-04-03 DIAGNOSIS — Z87891 Personal history of nicotine dependence: Secondary | ICD-10-CM

## 2024-04-03 DIAGNOSIS — Z122 Encounter for screening for malignant neoplasm of respiratory organs: Secondary | ICD-10-CM | POA: Diagnosis not present

## 2024-04-08 ENCOUNTER — Other Ambulatory Visit: Payer: Self-pay | Admitting: Family Medicine

## 2024-04-08 DIAGNOSIS — Z1231 Encounter for screening mammogram for malignant neoplasm of breast: Secondary | ICD-10-CM

## 2024-04-08 DIAGNOSIS — I2583 Coronary atherosclerosis due to lipid rich plaque: Secondary | ICD-10-CM | POA: Insufficient documentation

## 2024-05-02 ENCOUNTER — Ambulatory Visit: Admitting: Family Medicine

## 2024-05-07 ENCOUNTER — Ambulatory Visit: Admitting: Family Medicine

## 2024-05-08 ENCOUNTER — Telehealth: Payer: Self-pay

## 2024-05-08 NOTE — Telephone Encounter (Signed)
 Copied from CRM [id number]. Topic: Complaint (DO NOT CONVERT) - Billing/Coding >> May 08, 2024 12:11 PM Brittany M wrote: DOS: 1/9 & 1/14 Details of complaint: Patient originally had appt on the 1/9 and had to be rescheduled due to provider out for family emergency- rescheduled to 1/14 and patient had to cancel due to no ride to the office and cancelled the day before. She received a bill for $177. Please contact patient How would the patient like to see this issue resolved? She would like a call to go over the bill please.   Route to Research Officer, Political Party. _________________________________________  I called and spoke to patient. Her bill was actually from [facility] 03/27/24 for her CPE and labs with Dr. Catherine.  She said she has never rec'd a bill for her physical before.  I asked her to allow me to do some research/review her inquiry, I will call her back. She agreed and thanked me for my prompt return call.

## 2024-06-03 ENCOUNTER — Institutional Professional Consult (permissible substitution): Admitting: Plastic Surgery

## 2024-07-30 ENCOUNTER — Encounter
